# Patient Record
Sex: Female | Born: 1975 | Hispanic: No | Smoking: Never smoker
Health system: Southern US, Community
[De-identification: ages and names within clinical notes are randomized; demographics above are authoritative.]

## PROBLEM LIST (undated history)

## (undated) HISTORY — PX: EYE SURGERY: SHX253

---

## 1998-05-19 ENCOUNTER — Other Ambulatory Visit: Admission: RE | Admit: 1998-05-19 | Discharge: 1998-05-19 | Payer: Self-pay | Admitting: Obstetrics and Gynecology

## 1999-08-15 ENCOUNTER — Other Ambulatory Visit: Admission: RE | Admit: 1999-08-15 | Discharge: 1999-08-15 | Payer: Self-pay | Admitting: Obstetrics and Gynecology

## 2000-01-31 ENCOUNTER — Other Ambulatory Visit: Admission: RE | Admit: 2000-01-31 | Discharge: 2000-01-31 | Payer: Self-pay | Admitting: Obstetrics and Gynecology

## 2000-08-15 ENCOUNTER — Other Ambulatory Visit: Admission: RE | Admit: 2000-08-15 | Discharge: 2000-08-15 | Payer: Self-pay | Admitting: Obstetrics and Gynecology

## 2001-02-18 ENCOUNTER — Encounter: Payer: Self-pay | Admitting: Emergency Medicine

## 2001-02-18 ENCOUNTER — Emergency Department (HOSPITAL_COMMUNITY): Admission: EM | Admit: 2001-02-18 | Discharge: 2001-02-18 | Payer: Self-pay | Admitting: Emergency Medicine

## 2001-09-10 ENCOUNTER — Other Ambulatory Visit: Admission: RE | Admit: 2001-09-10 | Discharge: 2001-09-10 | Payer: Self-pay | Admitting: Obstetrics and Gynecology

## 2002-04-01 ENCOUNTER — Encounter: Payer: Self-pay | Admitting: Obstetrics and Gynecology

## 2002-04-01 ENCOUNTER — Encounter: Admission: RE | Admit: 2002-04-01 | Discharge: 2002-04-01 | Payer: Self-pay | Admitting: Obstetrics and Gynecology

## 2002-04-28 ENCOUNTER — Ambulatory Visit (HOSPITAL_COMMUNITY): Admission: RE | Admit: 2002-04-28 | Discharge: 2002-04-28 | Payer: Self-pay | Admitting: Obstetrics and Gynecology

## 2002-08-01 ENCOUNTER — Inpatient Hospital Stay (HOSPITAL_COMMUNITY): Admission: AD | Admit: 2002-08-01 | Discharge: 2002-08-06 | Payer: Self-pay | Admitting: Obstetrics and Gynecology

## 2002-08-13 ENCOUNTER — Observation Stay (HOSPITAL_COMMUNITY): Admission: AD | Admit: 2002-08-13 | Discharge: 2002-08-14 | Payer: Self-pay | Admitting: Obstetrics and Gynecology

## 2002-09-29 ENCOUNTER — Other Ambulatory Visit: Admission: RE | Admit: 2002-09-29 | Discharge: 2002-09-29 | Payer: Self-pay | Admitting: Obstetrics and Gynecology

## 2003-11-08 ENCOUNTER — Other Ambulatory Visit: Admission: RE | Admit: 2003-11-08 | Discharge: 2003-11-08 | Payer: Self-pay | Admitting: Obstetrics and Gynecology

## 2004-02-13 ENCOUNTER — Encounter: Admission: RE | Admit: 2004-02-13 | Discharge: 2004-02-13 | Payer: Self-pay | Admitting: Gastroenterology

## 2004-05-21 ENCOUNTER — Ambulatory Visit (HOSPITAL_COMMUNITY): Admission: RE | Admit: 2004-05-21 | Discharge: 2004-05-21 | Payer: Self-pay | Admitting: Gastroenterology

## 2004-11-08 ENCOUNTER — Inpatient Hospital Stay (HOSPITAL_COMMUNITY): Admission: AD | Admit: 2004-11-08 | Discharge: 2004-11-08 | Payer: Self-pay | Admitting: Obstetrics and Gynecology

## 2004-12-18 ENCOUNTER — Other Ambulatory Visit: Admission: RE | Admit: 2004-12-18 | Discharge: 2004-12-18 | Payer: Self-pay | Admitting: Obstetrics and Gynecology

## 2005-02-06 ENCOUNTER — Ambulatory Visit (HOSPITAL_COMMUNITY): Admission: RE | Admit: 2005-02-06 | Discharge: 2005-02-06 | Payer: Self-pay | Admitting: Obstetrics and Gynecology

## 2005-06-17 ENCOUNTER — Ambulatory Visit (HOSPITAL_COMMUNITY): Admission: RE | Admit: 2005-06-17 | Discharge: 2005-06-17 | Payer: Self-pay | Admitting: Obstetrics and Gynecology

## 2005-08-28 ENCOUNTER — Inpatient Hospital Stay (HOSPITAL_COMMUNITY): Admission: AD | Admit: 2005-08-28 | Discharge: 2005-08-31 | Payer: Self-pay | Admitting: Obstetrics and Gynecology

## 2010-04-22 ENCOUNTER — Ambulatory Visit: Payer: Self-pay | Admitting: Family Medicine

## 2010-11-12 ENCOUNTER — Ambulatory Visit: Payer: Self-pay | Admitting: Internal Medicine

## 2012-09-14 ENCOUNTER — Other Ambulatory Visit: Payer: Self-pay | Admitting: Obstetrics and Gynecology

## 2012-09-14 DIAGNOSIS — N6002 Solitary cyst of left breast: Secondary | ICD-10-CM

## 2012-09-25 ENCOUNTER — Ambulatory Visit
Admission: RE | Admit: 2012-09-25 | Discharge: 2012-09-25 | Disposition: A | Discharge status: Home / Self Care | Payer: BC Managed Care – PPO | Source: Ambulatory Visit | Attending: Obstetrics and Gynecology | Admitting: Obstetrics and Gynecology

## 2012-09-25 DIAGNOSIS — N6002 Solitary cyst of left breast: Secondary | ICD-10-CM

## 2014-03-29 DIAGNOSIS — I493 Ventricular premature depolarization: Secondary | ICD-10-CM | POA: Insufficient documentation

## 2014-05-27 ENCOUNTER — Other Ambulatory Visit: Payer: Self-pay

## 2014-05-27 DIAGNOSIS — Z1231 Encounter for screening mammogram for malignant neoplasm of breast: Secondary | ICD-10-CM

## 2014-06-06 ENCOUNTER — Ambulatory Visit: Payer: Self-pay

## 2014-06-20 ENCOUNTER — Ambulatory Visit
Admission: RE | Admit: 2014-06-20 | Discharge: 2014-06-20 | Disposition: A | Discharge status: Home / Self Care | Payer: Self-pay | Source: Ambulatory Visit

## 2014-06-20 DIAGNOSIS — Z1231 Encounter for screening mammogram for malignant neoplasm of breast: Secondary | ICD-10-CM

## 2015-11-10 ENCOUNTER — Other Ambulatory Visit: Payer: Self-pay | Admitting: Obstetrics and Gynecology

## 2015-11-10 DIAGNOSIS — R928 Other abnormal and inconclusive findings on diagnostic imaging of breast: Secondary | ICD-10-CM

## 2015-11-27 ENCOUNTER — Ambulatory Visit
Admission: RE | Admit: 2015-11-27 | Discharge: 2015-11-27 | Disposition: A | Discharge status: Home / Self Care | Payer: BLUE CROSS/BLUE SHIELD | Source: Ambulatory Visit | Attending: Obstetrics and Gynecology | Admitting: Obstetrics and Gynecology

## 2015-11-27 DIAGNOSIS — R928 Other abnormal and inconclusive findings on diagnostic imaging of breast: Secondary | ICD-10-CM

## 2017-04-01 ENCOUNTER — Other Ambulatory Visit: Payer: Self-pay

## 2017-04-01 ENCOUNTER — Ambulatory Visit
Admission: EM | Admit: 2017-04-01 | Discharge: 2017-04-01 | Disposition: A | Discharge status: Home / Self Care | Payer: BLUE CROSS/BLUE SHIELD | Attending: Family Medicine | Admitting: Family Medicine

## 2017-04-01 DIAGNOSIS — J029 Acute pharyngitis, unspecified: Secondary | ICD-10-CM

## 2017-04-01 DIAGNOSIS — J069 Acute upper respiratory infection, unspecified: Secondary | ICD-10-CM

## 2017-04-01 LAB — RAPID STREP SCREEN (MED CTR MEBANE ONLY): STREPTOCOCCUS, GROUP A SCREEN (DIRECT): NEGATIVE

## 2017-04-01 MED ORDER — LIDOCAINE VISCOUS 2 % MT SOLN: 15.0000 mL | Freq: Three times a day (TID) | OROMUCOSAL | 0 refills | Status: DC | PRN

## 2017-04-01 NOTE — ED Provider Notes (Signed)
[facility] ____________________________________________  Time seen: Approximately 8:57 AM  I have reviewed the triage vital signs and the nursing notes.   HISTORY  Chief Complaint Sore Throat   HPI Michelle Peck is a 41 y.o. female presenting for evaluation of sore throat that is been present for the last 2-3 days.  Patient reports some accompanying nasal congestion and sneezing.  Reports her son also with similar complaints.  Denies other known sick contacts.  Denies accompanying fevers.  States sore throat is mostly sore with swallowing, and states that sore throat is more sore on the left side going towards her left ear.  Denies ear pain.  Reports continues to eat and drink well.  States did take some over-the-counter Tylenol yesterday.  No other over-the-counter medications taken for the same complaint.  Denies chest pain, shortness of breath, abdominal pain, dysuria,  or rash. Denies recent sickness. Denies recent antibiotic use.   Patient's last menstrual period was 03/18/2017.  Denies pregnancy Dian Queen, MD: PCP   History reviewed. No pertinent past medical history.  There are no active problems to display for this patient.   Past Surgical History:  Procedure Laterality Date  . CESAREAN SECTION     x 2     No current facility-administered medications for this encounter.   Current Outpatient Medications:  .  norethindrone-ethinyl estradiol (JUNEL FE,GILDESS FE,LOESTRIN FE) 1-20 MG-MCG tablet, Take 1 tablet daily by mouth., Disp: , Rfl:  .  lidocaine (XYLOCAINE) 2 % solution, Use as directed 15 mLs every 8 (eight) hours as needed in the mouth or throat (sore throat. gargle and spit as needed for sore throat.)., Disp: 100 mL, Rfl: 0  Allergies Iohexol  History reviewed. No pertinent family history.  Social History Social History   Tobacco Use  . Smoking status: Never Smoker  . Smokeless tobacco: Never Used  Substance Use Topics  . Alcohol  use: Yes    Comment: socially  . Drug use: No    Review of Systems Constitutional: No fever/chills ENT: positive sore throat. Cardiovascular: Denies chest pain. Respiratory: Denies shortness of breath. Gastrointestinal: No abdominal pain.   Musculoskeletal: Negative for back pain. Skin: Negative for rash. ____________________________________________   PHYSICAL EXAM:  VITAL SIGNS: ED Triage Vitals  Enc Vitals Group     BP 04/01/17 0844 122/75     Pulse Rate 04/01/17 0844 66     Resp 04/01/17 0844 17     Temp 04/01/17 0844 98.1 F (36.7 C)     Temp Source 04/01/17 0844 Oral     SpO2 04/01/17 0844 100 %     Weight 04/01/17 0842 155 lb (70.3 kg)     Height 04/01/17 0842 5' 8"  (1.727 m)     Head Circumference --      Peak Flow --      Pain Score 04/01/17 0842 5     Pain Loc --      Pain Edu? --      Excl. in [place]? --     Constitutional: Alert and oriented. Well appearing and in no acute distress. Eyes: Conjunctivae are normal.  Head: Atraumatic. No sinus tenderness to palpation. No swelling. No erythema.  Ears: no erythema, normal TMs bilaterally.   Nose:Mild nasal congestion   Mouth/Throat: Mucous membranes are moist. Mild pharyngeal erythema. No tonsillar swelling or exudate.  Neck: No stridor.  No cervical spine tenderness to palpation. Hematological/Lymphatic/Immunilogical: No cervical lymphadenopathy. Cardiovascular: Normal rate, regular rhythm. Grossly normal heart sounds.  Good peripheral circulation. Respiratory: Normal respiratory effort.  No retractions. No wheezes, rales or rhonchi. Good air movement.  Gastrointestinal: Soft and nontender. Musculoskeletal: Ambulatory with steady gait.  Neurologic:  Normal speech and language. No gait instability. Skin:  Skin appears warm, dry and intact. No rash noted. Psychiatric: Mood and affect are normal. Speech and behavior are normal.  ___________________________________________   LABS (all labs ordered are listed,  but only abnormal results are displayed)  Labs Reviewed  RAPID STREP SCREEN (NOT AT [facility])  CULTURE, GROUP A STREP [facility])    PROCEDURES Procedures   INITIAL IMPRESSION / ASSESSMENT AND PLAN / ED COURSE  Pertinent labs & imaging results that were available during my care of the patient were reviewed by me and considered in my medical decision making (see chart for details).  Well-appearing patient.  No acute distress.  Suspect viral pharyngitis and viral upper respiratory infection. Quick strep negative, will culture.  Encourage rest, fluids, supportive care, over-the-counter antihistamine, and prn lidocaine gargles.Discussed indication, risks and benefits of medications with patient.  Discussed follow up with Primary care physician this week as needed. Discussed follow up and return parameters including no resolution or any worsening concerns. Patient verbalized understanding and agreed to plan.   ____________________________________________   FINAL CLINICAL IMPRESSION(S) / ED DIAGNOSES  Final diagnoses:  Pharyngitis, unspecified etiology  Upper respiratory tract infection, unspecified type     ED Discharge Orders        Ordered    lidocaine (XYLOCAINE) 2 % solution  Every 8 hours PRN     04/01/17 0902       Note: This dictation was prepared with Dragon dictation along with smaller phrase technology. Any transcriptional errors that result from this process are unintentional.         Marylene Land, NP 04/01/17 [phone]

## 2017-04-01 NOTE — ED Triage Notes (Signed)
Patient complains of sore throat that seems to be worse on the left side. Patient states that she feels like the pain radiates to her left ear. Patient reports that symptoms started on Saturday.

## 2017-04-01 NOTE — Discharge Instructions (Signed)
Take medication as prescribed. Rest. Drink plenty of fluids.  ° °Follow up with your primary care physician this week as needed. Return to Urgent care for new or worsening concerns.  ° °

## 2017-04-04 LAB — CULTURE, GROUP A STREP (THRC)

## 2018-01-11 ENCOUNTER — Ambulatory Visit
Admission: EM | Admit: 2018-01-11 | Discharge: 2018-01-11 | Disposition: A | Discharge status: Home / Self Care | Payer: BLUE CROSS/BLUE SHIELD | Attending: Family Medicine | Admitting: Family Medicine

## 2018-01-11 DIAGNOSIS — R05 Cough: Secondary | ICD-10-CM

## 2018-01-11 DIAGNOSIS — J069 Acute upper respiratory infection, unspecified: Secondary | ICD-10-CM | POA: Diagnosis not present

## 2018-01-11 DIAGNOSIS — B9789 Other viral agents as the cause of diseases classified elsewhere: Secondary | ICD-10-CM | POA: Diagnosis not present

## 2018-01-11 LAB — MONONUCLEOSIS SCREEN: Mono Screen: NEGATIVE

## 2018-01-11 NOTE — ED Triage Notes (Signed)
Pt was seen on Wednesday at a urgent care, strep negative but they gave her amoxicillin and she is still having sore throat, feeling bad and headache. Taking the antibiotic along with ibuprofen and tylenol without relief.

## 2018-01-11 NOTE — ED Provider Notes (Signed)
[facility]    CSN: [id number] Arrival date & time: 01/11/18  0102     History   Chief Complaint Chief Complaint  Patient presents with  . Sore Throat    HPI Michelle Peck is a 42 y.o. female.   The history is provided by the patient.  Sore Throat   URI  Presenting symptoms: congestion, cough, rhinorrhea and sore throat   Severity:  Moderate Onset quality:  Sudden Duration:  5 days Timing:  Constant Progression:  Unchanged Chronicity:  New Relieved by:  Nothing Ineffective treatments:  Prescription medications Associated symptoms: myalgias   Associated symptoms: no sinus pain and no wheezing   Risk factors: sick contacts   Risk factors: not elderly, no chronic cardiac disease, no chronic kidney disease, no chronic respiratory disease, no diabetes mellitus, no immunosuppression, no recent illness and no recent travel     History reviewed. No pertinent past medical history.  Patient Active Problem List   Diagnosis Date Noted  . 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 CESAREAN SECTION     x 2    OB History   None      Home Medications    Prior to Admission medications   Medication Sig Start Date End Date Taking? Authorizing Provider  amoxicillin (AMOXIL) 400 MG/5ML suspension  01/08/18  Yes [provider]  citalopram (CELEXA) 20 MG tablet Take 20 mg by mouth daily. 11/17/17  Yes [provider]  norethindrone-ethinyl estradiol (JUNEL FE,GILDESS FE,LOESTRIN FE) 1-20 MG-MCG tablet Take 1 tablet daily by mouth.   Yes [provider]  lidocaine (XYLOCAINE) 2 % solution Use as directed 15 mLs every 8 (eight) hours as needed in the mouth or throat (sore throat. gargle and spit as needed for sore throat.). 04/01/17   Marylene Land, NP    Family History No family history on file.  Social History Social History   Tobacco Use  . Smoking status: Never Smoker  . Smokeless  tobacco: Never Used  Substance Use Topics  . Alcohol use: Yes    Comment: socially  . Drug use: No     Allergies   Iohexol   Review of Systems Review of Systems  HENT: Positive for congestion, rhinorrhea and sore throat. Negative for sinus pain.   Respiratory: Positive for cough. Negative for wheezing.   Musculoskeletal: Positive for myalgias.     Physical Exam Triage Vital Signs ED Triage Vitals  Enc Vitals Group     BP 01/11/18 0952 125/83     Pulse Rate 01/11/18 0952 83     Resp 01/11/18 0952 18     Temp 01/11/18 0952 98.1 F (36.7 C)     Temp Source 01/11/18 0952 Oral     SpO2 01/11/18 0952 99 %     Weight 01/11/18 0956 160 lb (72.6 kg)     Height --      Head Circumference --      Peak Flow --      Pain Score 01/11/18 0956 5     Pain Loc --      Pain Edu? --      Excl. in [place]? --    No data found.  Updated Vital Signs BP 125/83 (BP Location: Left Arm)   Pulse 83   Temp 98.1 F (36.7 C) (Oral)   Resp 18   Wt 72.6 kg   LMP 12/21/2017 (Exact Date)   SpO2 99%  BMI 24.33 kg/m   Visual Acuity Right Eye Distance:   Left Eye Distance:   Bilateral Distance:    Right Eye Near:   Left Eye Near:    Bilateral Near:     Physical Exam  Constitutional: She appears well-developed and well-nourished. No distress.  HENT:  Head: Normocephalic and atraumatic.  Right Ear: Tympanic membrane, external ear and ear canal normal.  Left Ear: Tympanic membrane, external ear and ear canal normal.  Nose: Mucosal edema and rhinorrhea present. No nose lacerations, sinus tenderness, nasal deformity, septal deviation or nasal septal hematoma. No epistaxis.  No foreign bodies. Right sinus exhibits no maxillary sinus tenderness and no frontal sinus tenderness. Left sinus exhibits no maxillary sinus tenderness and no frontal sinus tenderness.  Mouth/Throat: Uvula is midline and mucous membranes are normal. Posterior oropharyngeal erythema present. No oropharyngeal exudate,  posterior oropharyngeal edema or tonsillar abscesses. No tonsillar exudate.  Eyes: Pupils are equal, round, and reactive to light. Conjunctivae and EOM are normal. Right eye exhibits no discharge. Left eye exhibits no discharge. No scleral icterus.  Neck: Normal range of motion. Neck supple. No thyromegaly present.  Cardiovascular: Normal rate, regular rhythm and normal heart sounds.  Pulmonary/Chest: Effort normal and breath sounds normal. No respiratory distress. She has no wheezes. She has no rales.  Lymphadenopathy:    She has no cervical adenopathy.  Skin: She is not diaphoretic.  Nursing note and vitals reviewed.    UC Treatments / Results  Labs (all labs ordered are listed, but only abnormal results are displayed) Labs Reviewed  MONONUCLEOSIS SCREEN    EKG None  Radiology No results found.  Procedures Procedures (including critical care time)  Medications Ordered in UC Medications - No data to display  Initial Impression / Assessment and Plan / UC Course  I have reviewed the triage vital signs and the nursing notes.  Pertinent labs & imaging results that were available during my care of the patient were reviewed by me and considered in my medical decision making (see chart for details).      Final Clinical Impressions(s) / UC Diagnoses   Final diagnoses:  Viral URI with cough     Discharge Instructions     Rest, fluids, tylenol/advil    ED Prescriptions    None     1. Labs results and diagnosis reviewed with patient 2. Recommend supportive treatment as above 3. Follow-up prn if symptoms worsen or don't improve   Controlled Substance Prescriptions [place] Controlled Substance Registry consulted? Not Applicable   Norval Gable, MD 01/11/18 1430

## 2018-01-11 NOTE — Discharge Instructions (Signed)
Rest, fluids, tylenol/advil

## 2019-01-30 ENCOUNTER — Other Ambulatory Visit: Payer: Self-pay

## 2019-01-30 ENCOUNTER — Encounter: Payer: Self-pay | Admitting: Gynecology

## 2019-01-30 ENCOUNTER — Ambulatory Visit
Admission: EM | Admit: 2019-01-30 | Discharge: 2019-01-30 | Disposition: A | Discharge status: Home / Self Care | Payer: BC Managed Care – PPO | Attending: Internal Medicine | Admitting: Internal Medicine

## 2019-01-30 DIAGNOSIS — X500XXA Overexertion from strenuous movement or load, initial encounter: Secondary | ICD-10-CM | POA: Diagnosis not present

## 2019-01-30 DIAGNOSIS — S39012A Strain of muscle, fascia and tendon of lower back, initial encounter: Secondary | ICD-10-CM | POA: Diagnosis not present

## 2019-01-30 MED ORDER — KETOROLAC TROMETHAMINE 60 MG/2ML IM SOLN
60.0000 mg | Freq: Once | INTRAMUSCULAR | Status: AC
  Administered 2019-01-30: 13:00:00 60 mg via INTRAMUSCULAR

## 2019-01-30 MED ORDER — CYCLOBENZAPRINE HCL 10 MG PO TABS: ORAL_TABLET | ORAL | 0 refills | Status: DC

## 2019-01-30 NOTE — ED Provider Notes (Signed)
[facility]    CSN: [id number] Arrival date & time: 01/30/19  1155      History   Chief Complaint Chief Complaint  Patient presents with  . Appointment  . Back Pain    HPI Michelle Peck is a 43 y.o. female. who presents with low back pain which developed this am after trying to pick up laundry from the floor. She could not raise up due to the pain. She took one dose of Aleeve at 11 am and is not helping. Denies pain radiating to her legs or paresthesia of lower legs. Denies past hx of having back issues or pain like this.     History reviewed. No pertinent past medical history.  Patient Active Problem List   Diagnosis Date Noted  . 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 CESAREAN SECTION     x 2    OB History   No obstetric history on file.      Home Medications    Prior to Admission medications   Medication Sig Start Date End Date Taking? Authorizing Provider  citalopram (CELEXA) 20 MG tablet Take 20 mg by mouth daily. 11/17/17  Yes [provider]  norethindrone-ethinyl estradiol (JUNEL FE,GILDESS FE,LOESTRIN FE) 1-20 MG-MCG tablet Take 1 tablet daily by mouth.   Yes [provider]  amoxicillin (AMOXIL) 400 MG/5ML suspension  01/08/18   [provider]  lidocaine (XYLOCAINE) 2 % solution Use as directed 15 mLs every 8 (eight) hours as needed in the mouth or throat (sore throat. gargle and spit as needed for sore throat.). 04/01/17   Marylene Land, NP    Family History History reviewed. No pertinent family history.  Social History Social History   Tobacco Use  . Smoking status: Never Smoker  . Smokeless tobacco: Never Used  Substance Use Topics  . Alcohol use: Yes    Comment: socially  . Drug use: No     Allergies   Iohexol   Review of Systems Review of Systems  Gastrointestinal: Negative for abdominal pain.       Denies bowel incontinence  Genitourinary:  Negative for difficulty urinating.  Musculoskeletal: Positive for back pain.  Skin: Negative for rash and wound.  Neurological: Negative for weakness and numbness.   Physical Exam Triage Vital Signs ED Triage Vitals  Enc Vitals Group     BP 01/30/19 1210 114/77     Pulse Rate 01/30/19 1210 80     Resp 01/30/19 1210 16     Temp 01/30/19 1210 98.1 F (36.7 C)     Temp Source 01/30/19 1210 Oral     SpO2 01/30/19 1210 99 %     Weight 01/30/19 1209 160 lb (72.6 kg)     Height 01/30/19 1209 5' 8"  (1.727 m)     Head Circumference --      Peak Flow --      Pain Score 01/30/19 1209 7     Pain Loc --      Pain Edu? --      Excl. in [place]? --    No data found.  Updated Vital Signs BP 114/77 (BP Location: Left Arm)   Pulse 80   Temp 98.1 F (36.7 C) (Oral)   Resp 16   Ht 5' 8"  (1.727 m)   Wt 160 lb (72.6 kg)   LMP 01/06/2019   SpO2 99%   BMI 24.33 kg/m   Visual Acuity Right Eye  Distance:   Left Eye Distance:   Bilateral Distance:    Right Eye Near:   Left Eye Near:    Bilateral Near:     Physical Exam Vitals signs and nursing note reviewed.  Constitutional:      General: She is in acute distress.     Comments: In pain when she tries to straighten her back fully. Stands leaning a little forward  HENT:     Right Ear: External ear normal.     Left Ear: External ear normal.     Nose: Nose normal.  Eyes:     General: No scleral icterus.    Conjunctiva/sclera: Conjunctivae normal.  Pulmonary:     Effort: Pulmonary effort is normal.  Abdominal:     General: Abdomen is flat. Bowel sounds are normal. There is no distension.     Palpations: Abdomen is soft. There is no mass.     Tenderness: There is no abdominal tenderness. There is no guarding or rebound.     Hernia: No hernia is present.  Musculoskeletal: Normal range of motion.     Comments: BACK- R lower sacral region next to SI is tense and very tender. No vertebral tenderness of the L/S region. I could not fully do  my exam initially due to her pain, so 20 minutes afte giving her Toradol 60 mg IM which helped a little, she was able to straighten her back a little better, but still not fully. Anterior flexion was normal, unable to flex back. R lateral flexion and L thorax rotation provoked the pain on her lower back bilaterally. R lower back pain provoked when examining L hip,to flex it and try to externally flex it caused too much pain. Checking her R hip did not provoke as much pain. Has neg SLR.  She also could not lift her thighs off the table to check her strength since it provoked her pain.   Skin:    General: Skin is warm.     Findings: No rash.  Neurological:     Mental Status: She is alert and oriented to person, place, and time.     Motor: No weakness.     Gait: Gait normal.     Deep Tendon Reflexes: Reflexes normal.  Psychiatric:        Mood and Affect: Mood normal.        Behavior: Behavior normal.        Thought Content: Thought content normal.        Judgment: Judgment normal.    UC Treatments / Results  Labs (all labs ordered are listed, but only abnormal results are displayed) Labs Reviewed - No data to display  EKG   Radiology No results found.  Procedures Procedures (including critical care time)  Medications Ordered in UC Medications - No data to display  Initial Impression / Assessment and Plan / UC Course  I have reviewed the triage vital signs and the nursing notes. She was given Toradol 60 mg IM here and ice pack to apply to her back while the Toradol worked. I sent her home to continue the ice, and placed her on Flexeril 1/2 - 1 tid x 7 days prn. May continue the [facility] as she has been as well. See instructions Caudal equina symptoms reviewed and if they occur needs to go to ER.   Final Clinical Impressions(s) / UC Diagnoses   Final diagnoses:  None   Discharge Instructions   None    ED Prescriptions  None     PDMP not reviewed this encounter.    Shelby Mattocks, PA-C 01/30/19 1404

## 2019-01-30 NOTE — ED Triage Notes (Signed)
Patient with back pain. Per patient bend over to pick laundry up and was unable to get back up x this morning.

## 2019-01-30 NOTE — Discharge Instructions (Addendum)
Continue icing your back 15-20 min every 2 hours as able. You may take Aleeve dose 7 pm, then continue twice a day for 7 days to help pain and inflammation Take the muscle relaxer 1/2 to 1 up to three times a day for muscle spasm After 48 hours you may alternate with heat.  Follow up with your family Dr next week.

## 2019-02-26 ENCOUNTER — Ambulatory Visit
Admission: EM | Admit: 2019-02-26 | Discharge: 2019-02-26 | Disposition: A | Discharge status: Home / Self Care | Payer: BC Managed Care – PPO | Attending: Family Medicine | Admitting: Family Medicine

## 2019-02-26 ENCOUNTER — Encounter: Payer: Self-pay | Admitting: Emergency Medicine

## 2019-02-26 ENCOUNTER — Other Ambulatory Visit: Payer: Self-pay

## 2019-02-26 ENCOUNTER — Ambulatory Visit (INDEPENDENT_AMBULATORY_CARE_PROVIDER_SITE_OTHER): Payer: BC Managed Care – PPO

## 2019-02-26 DIAGNOSIS — S6000XA Contusion of unspecified finger without damage to nail, initial encounter: Secondary | ICD-10-CM

## 2019-02-26 DIAGNOSIS — Y9368 Activity, volleyball (beach) (court): Secondary | ICD-10-CM | POA: Diagnosis not present

## 2019-02-26 DIAGNOSIS — M79644 Pain in right finger(s): Secondary | ICD-10-CM

## 2019-02-26 NOTE — Discharge Instructions (Signed)
Rest, ice, over the counter tylenol/motrin as needed

## 2019-02-26 NOTE — ED Triage Notes (Signed)
Patient states that she was playing volleyball with her kids yesterday and when she went up to block the ball she injured her right 4th finger.  Patient c/o swelling and pain in her right 4th finger.

## 2019-02-28 NOTE — ED Provider Notes (Signed)
[facility]    CSN: [id number] Arrival date & time: 02/26/19  0855      History   Chief Complaint Chief Complaint  Patient presents with  . Finger Injury    APPT right 4th finger    HPI Shalea DANELIA SNODGRASS is a 43 y.o. female.   43 yo female with a c/o right 4th finger pain after injuring it yesterday playing volleyball. States the ball hit/jammed her finger.      History reviewed. No pertinent past medical history.  Patient Active Problem List   Diagnosis Date Noted  . 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 CESAREAN SECTION     x 2    OB History   No obstetric history on file.      Home Medications    Prior to Admission medications   Medication Sig Start Date End Date Taking? Authorizing Provider  citalopram (CELEXA) 20 MG tablet Take 20 mg by mouth daily. 11/17/17  Yes [provider]  norethindrone-ethinyl estradiol (JUNEL FE,GILDESS FE,LOESTRIN FE) 1-20 MG-MCG tablet Take 1 tablet daily by mouth.   Yes [provider]  cyclobenzaprine (FLEXERIL) 10 MG tablet 1/2 to 1 tid prn muscle spasms 01/30/19   Rodriguez-Southworth, Sunday Spillers, PA-C    Family History Family History  Problem Relation Age of Onset  . Healthy Mother   . Healthy Father     Social History Social History   Tobacco Use  . Smoking status: Never Smoker  . Smokeless tobacco: Never Used  Substance Use Topics  . Alcohol use: Yes    Comment: socially  . Drug use: No     Allergies   Iohexol   Review of Systems Review of Systems   Physical Exam Triage Vital Signs ED Triage Vitals  Enc Vitals Group     BP 02/26/19 0907 115/83     Pulse Rate 02/26/19 0907 76     Resp 02/26/19 0907 14     Temp 02/26/19 0907 98.1 F (36.7 C)     Temp Source 02/26/19 0907 Oral     SpO2 02/26/19 0907 98 %     Weight 02/26/19 0904 160 lb (72.6 kg)     Height 02/26/19 0904 5' 8"  (1.727 m)     Head Circumference --      Peak  Flow --      Pain Score 02/26/19 0904 6     Pain Loc --      Pain Edu? --      Excl. in [place]? --    No data found.  Updated Vital Signs BP 115/83 (BP Location: Left Arm)   Pulse 76   Temp 98.1 F (36.7 C) (Oral)   Resp 14   Ht 5' 8"  (1.727 m)   Wt 72.6 kg   LMP 02/05/2019 (Approximate)   SpO2 98%   BMI 24.33 kg/m   Visual Acuity Right Eye Distance:   Left Eye Distance:   Bilateral Distance:    Right Eye Near:   Left Eye Near:    Bilateral Near:     Physical Exam Vitals signs and nursing note reviewed.  Constitutional:      General: She is not in acute distress.    Appearance: She is not toxic-appearing or diaphoretic.  Musculoskeletal:     Right hand: She exhibits bony tenderness (over the ring finger). She exhibits normal range of motion, normal two-point discrimination, normal capillary refill, no deformity, no laceration  and no swelling. Normal sensation noted. Normal strength noted.     Comments: Hand neurovascularly intact  Neurological:     Mental Status: She is alert.      UC Treatments / Results  Labs (all labs ordered are listed, but only abnormal results are displayed) Labs Reviewed - No data to display  EKG   Radiology No results found.  Procedures Procedures (including critical care time)  Medications Ordered in UC Medications - No data to display  Initial Impression / Assessment and Plan / UC Course  I have reviewed the triage vital signs and the nursing notes.  Pertinent labs & imaging results that were available during my care of the patient were reviewed by me and considered in my medical decision making (see chart for details).      Final Clinical Impressions(s) / UC Diagnoses   Final diagnoses:  Contusion of finger of right hand, unspecified finger, initial encounter     Discharge Instructions     Rest, ice, over the counter tylenol/motrin as needed    ED Prescriptions    None      1. x-ray results and diagnosis  reviewed with patient 2. Recommend supportive treatment as above  3. Follow-up prn if symptoms worsen or don't improve  PDMP not reviewed this encounter.   Norval Gable, MD 02/28/19 1757

## 2020-10-19 IMAGING — CR DG FINGER RING 2+V*R*
3 series · 3 of 3 positions shown · non-contrast
Comparison: No recent.

CLINICAL DATA: Injury with swelling and pain.

EXAM:
RIGHT RING FINGER 2+V

[finger ap]
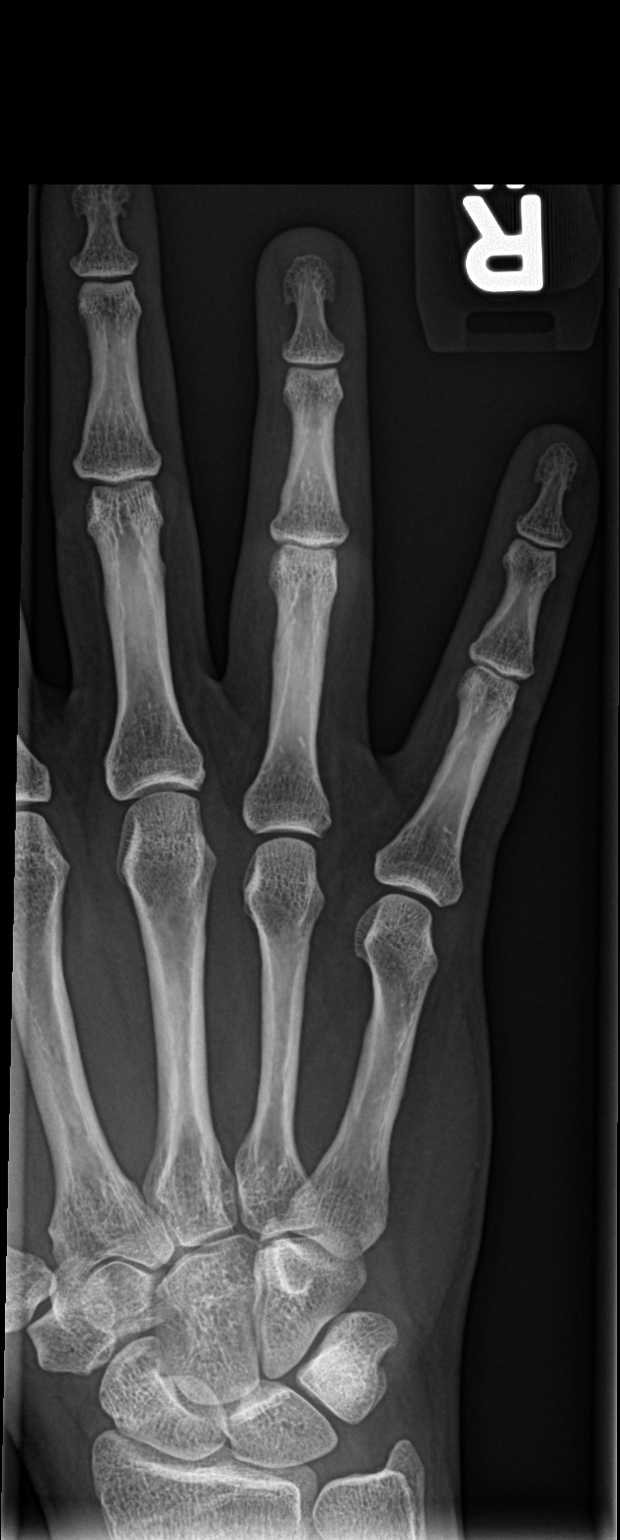

[finger obl]
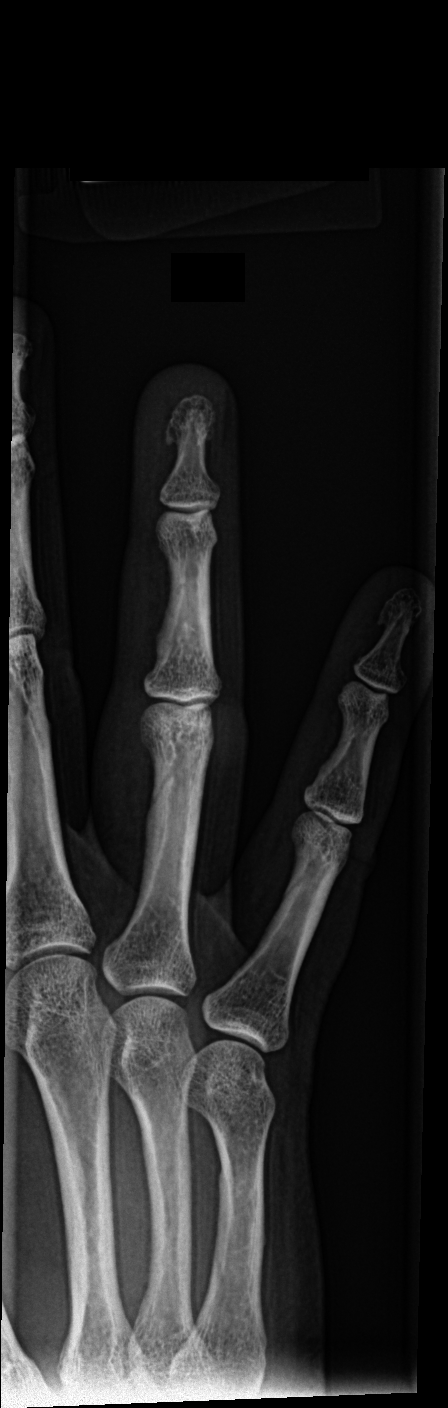

[finger lat]
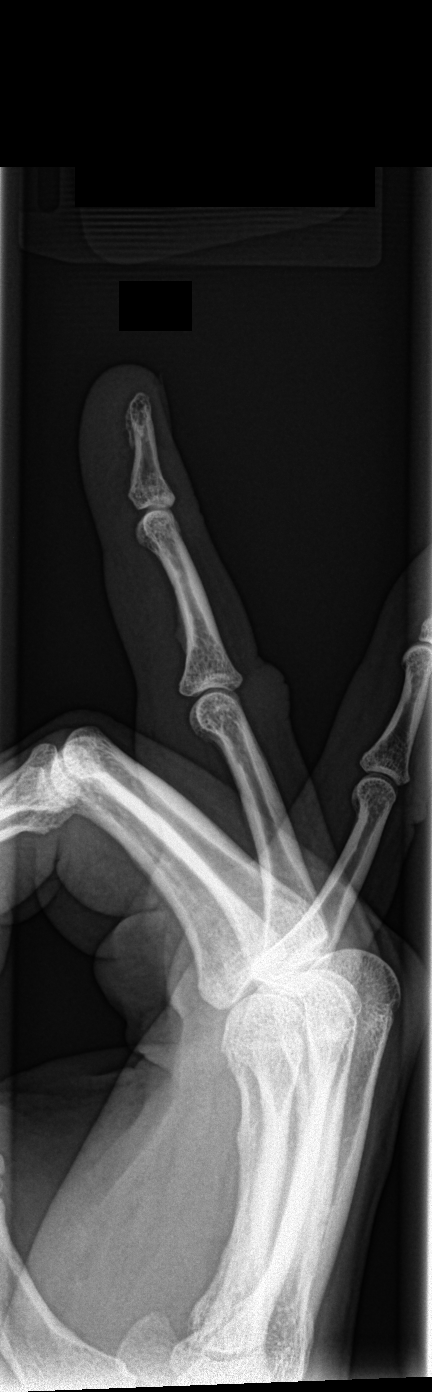

[3 of 3 positions shown; findings below may reference images not displayed]

FINDINGS: No acute soft tissue bony abnormality. No evidence of fracture
dislocation. No radiopaque foreign body.
IMPRESSION: No acute abnormality.

## 2021-09-18 ENCOUNTER — Other Ambulatory Visit: Payer: Self-pay

## 2021-09-18 ENCOUNTER — Emergency Department (HOSPITAL_BASED_OUTPATIENT_CLINIC_OR_DEPARTMENT_OTHER): Payer: BC Managed Care – PPO

## 2021-09-18 ENCOUNTER — Emergency Department (HOSPITAL_COMMUNITY): Payer: BC Managed Care – PPO | Admitting: Certified Registered Nurse Anesthetist

## 2021-09-18 ENCOUNTER — Encounter (HOSPITAL_COMMUNITY)
Admission: EM | Disposition: A | Discharge status: Home / Self Care | Payer: Self-pay | Source: Home / Self Care | Attending: Emergency Medicine

## 2021-09-18 ENCOUNTER — Ambulatory Visit (HOSPITAL_BASED_OUTPATIENT_CLINIC_OR_DEPARTMENT_OTHER)
Admission: EM | Admit: 2021-09-18 | Discharge: 2021-09-18 | Disposition: A | Discharge status: Home / Self Care | Payer: BC Managed Care – PPO | Attending: Emergency Medicine | Admitting: Emergency Medicine

## 2021-09-18 ENCOUNTER — Encounter (HOSPITAL_BASED_OUTPATIENT_CLINIC_OR_DEPARTMENT_OTHER): Payer: Self-pay | Admitting: Emergency Medicine

## 2021-09-18 DIAGNOSIS — K353 Acute appendicitis with localized peritonitis, without perforation or gangrene: Secondary | ICD-10-CM | POA: Diagnosis present

## 2021-09-18 DIAGNOSIS — K3580 Unspecified acute appendicitis: Secondary | ICD-10-CM | POA: Diagnosis not present

## 2021-09-18 HISTORY — PX: LAPAROSCOPIC APPENDECTOMY: SHX408

## 2021-09-18 LAB — CBC
HCT: 36.8 % (ref 36.0–46.0)
Hemoglobin: 12 g/dL (ref 12.0–15.0)
MCH: 31.7 pg (ref 26.0–34.0)
MCHC: 32.6 g/dL (ref 30.0–36.0)
MCV: 97.4 fL (ref 80.0–100.0)
Platelets: 294 10*3/uL (ref 150–400)
RBC: 3.78 MIL/uL — ABNORMAL LOW (ref 3.87–5.11)
RDW: 13.2 % (ref 11.5–15.5)
WBC: 9.3 10*3/uL (ref 4.0–10.5)
nRBC: 0 % (ref 0.0–0.2)

## 2021-09-18 LAB — COMPREHENSIVE METABOLIC PANEL
ALT: 13 U/L (ref 0–44)
AST: 14 U/L — ABNORMAL LOW (ref 15–41)
Albumin: 4.3 g/dL (ref 3.5–5.0)
Alkaline Phosphatase: 45 U/L (ref 38–126)
Anion gap: 10 (ref 5–15)
BUN: 7 mg/dL (ref 6–20)
CO2: 24 mmol/L (ref 22–32)
Calcium: 9.2 mg/dL (ref 8.9–10.3)
Chloride: 102 mmol/L (ref 98–111)
Creatinine, Ser: 0.65 mg/dL (ref 0.44–1.00)
GFR, Estimated: >60 mL/min (ref >60)
Glucose, Bld: 94 mg/dL (ref 70–99)
Potassium: 3.7 mmol/L (ref 3.5–5.1)
Sodium: 136 mmol/L (ref 135–145)
Total Bilirubin: 1.3 mg/dL — ABNORMAL HIGH (ref 0.3–1.2)
Total Protein: 6.9 g/dL (ref 6.5–8.1)

## 2021-09-18 LAB — URINALYSIS, ROUTINE W REFLEX MICROSCOPIC
Bilirubin Urine: NEGATIVE
Glucose, UA: NEGATIVE mg/dL
Hgb urine dipstick: NEGATIVE
Ketones, ur: NEGATIVE mg/dL
Leukocytes,Ua: NEGATIVE
Nitrite: NEGATIVE
Protein, ur: NEGATIVE mg/dL
Specific Gravity, Urine: <1.005 — ABNORMAL LOW (ref 1.005–1.030)
pH: 6.5 (ref 5.0–8.0)

## 2021-09-18 LAB — LIPASE, BLOOD: Lipase: 19 U/L (ref 11–51)

## 2021-09-18 LAB — HCG, SERUM, QUALITATIVE: Preg, Serum: NEGATIVE

## 2021-09-18 SURGERY — APPENDECTOMY, LAPAROSCOPIC
Anesthesia: General

## 2021-09-18 MED ORDER — OXYCODONE HCL 5 MG PO TABS: 5.0000 mg | ORAL_TABLET | Freq: Once | ORAL | Status: DC | PRN

## 2021-09-18 MED ORDER — MIDAZOLAM HCL 2 MG/2ML IJ SOLN
INTRAMUSCULAR | Status: AC
  Filled 2021-09-18: qty 2

## 2021-09-18 MED ORDER — LACTATED RINGERS IV SOLN: INTRAVENOUS | Status: DC

## 2021-09-18 MED ORDER — CELECOXIB 200 MG PO CAPS
ORAL_CAPSULE | ORAL | Status: AC
  Administered 2021-09-18: 200 mg via ORAL
  Filled 2021-09-18: qty 1

## 2021-09-18 MED ORDER — MIDAZOLAM HCL 2 MG/2ML IJ SOLN
INTRAMUSCULAR | Status: DC | PRN
  Administered 2021-09-18: 2 mg via INTRAVENOUS

## 2021-09-18 MED ORDER — CHLORHEXIDINE GLUCONATE CLOTH 2 % EX PADS: 6.0000 | MEDICATED_PAD | Freq: Once | CUTANEOUS | Status: DC

## 2021-09-18 MED ORDER — FENTANYL CITRATE (PF) 250 MCG/5ML IJ SOLN
INTRAMUSCULAR | Status: AC
  Filled 2021-09-18: qty 5

## 2021-09-18 MED ORDER — ARTIFICIAL TEARS OPHTHALMIC OINT
TOPICAL_OINTMENT | OPHTHALMIC | Status: AC
  Filled 2021-09-18: qty 3.5

## 2021-09-18 MED ORDER — 0.9 % SODIUM CHLORIDE (POUR BTL) OPTIME
TOPICAL | Status: DC | PRN
  Administered 2021-09-18: 1000 mL

## 2021-09-18 MED ORDER — OXYCODONE HCL 5 MG PO TABS: 5.0000 mg | ORAL_TABLET | Freq: Four times a day (QID) | ORAL | 0 refills | Status: DC | PRN

## 2021-09-18 MED ORDER — ACETAMINOPHEN 500 MG PO TABS
ORAL_TABLET | ORAL | Status: AC
  Administered 2021-09-18: 1000 mg via ORAL
  Filled 2021-09-18: qty 2

## 2021-09-18 MED ORDER — LIDOCAINE 2% (20 MG/ML) 5 ML SYRINGE
INTRAMUSCULAR | Status: AC
  Filled 2021-09-18: qty 5

## 2021-09-18 MED ORDER — ONDANSETRON HCL 4 MG/2ML IJ SOLN: 4.0000 mg | Freq: Once | INTRAMUSCULAR | Status: DC | PRN

## 2021-09-18 MED ORDER — CHLORHEXIDINE GLUCONATE 0.12 % MT SOLN
15.0000 mL | OROMUCOSAL | Status: AC
  Filled 2021-09-18: qty 15

## 2021-09-18 MED ORDER — LIDOCAINE 2% (20 MG/ML) 5 ML SYRINGE
INTRAMUSCULAR | Status: DC | PRN
  Administered 2021-09-18: 60 mg via INTRAVENOUS

## 2021-09-18 MED ORDER — CHLORHEXIDINE GLUCONATE 0.12 % MT SOLN
OROMUCOSAL | Status: AC
  Administered 2021-09-18: 15 mL via OROMUCOSAL
  Filled 2021-09-18: qty 15

## 2021-09-18 MED ORDER — MORPHINE SULFATE (PF) 2 MG/ML IV SOLN: 1.0000 mg | INTRAVENOUS | Status: DC | PRN

## 2021-09-18 MED ORDER — BUPIVACAINE-EPINEPHRINE (PF) 0.25% -1:200000 IJ SOLN
INTRAMUSCULAR | Status: AC
  Filled 2021-09-18: qty 30

## 2021-09-18 MED ORDER — PROPOFOL 10 MG/ML IV BOLUS
INTRAVENOUS | Status: DC | PRN
  Administered 2021-09-18: 200 mg via INTRAVENOUS

## 2021-09-18 MED ORDER — METRONIDAZOLE 500 MG/100ML IV SOLN
500.0000 mg | Freq: Once | INTRAVENOUS | Status: AC
  Administered 2021-09-18: 500 mg via INTRAVENOUS
  Filled 2021-09-18: qty 100

## 2021-09-18 MED ORDER — BUPIVACAINE-EPINEPHRINE 0.5% -1:200000 IJ SOLN
INTRAMUSCULAR | Status: AC
  Filled 2021-09-18: qty 1

## 2021-09-18 MED ORDER — FENTANYL CITRATE (PF) 100 MCG/2ML IJ SOLN
25.0000 ug | INTRAMUSCULAR | Status: DC | PRN
  Administered 2021-09-18: 50 ug via INTRAVENOUS

## 2021-09-18 MED ORDER — SODIUM CHLORIDE 0.9 % IR SOLN
Status: DC | PRN
  Administered 2021-09-18: 1000 mL

## 2021-09-18 MED ORDER — KETOROLAC TROMETHAMINE 30 MG/ML IJ SOLN
INTRAMUSCULAR | Status: DC | PRN
  Administered 2021-09-18: 30 mg via INTRAVENOUS

## 2021-09-18 MED ORDER — ROCURONIUM BROMIDE 10 MG/ML (PF) SYRINGE
PREFILLED_SYRINGE | INTRAVENOUS | Status: DC | PRN
  Administered 2021-09-18: 60 mg via INTRAVENOUS

## 2021-09-18 MED ORDER — SUGAMMADEX SODIUM 200 MG/2ML IV SOLN
INTRAVENOUS | Status: DC | PRN
  Administered 2021-09-18: 150 mg via INTRAVENOUS

## 2021-09-18 MED ORDER — ONDANSETRON HCL 4 MG/2ML IJ SOLN
4.0000 mg | INTRAMUSCULAR | Status: DC | PRN
Start: 2021-09-18 — End: 2021-09-18
  Administered 2021-09-18: 4 mg via INTRAVENOUS

## 2021-09-18 MED ORDER — ONDANSETRON HCL 4 MG/2ML IJ SOLN
INTRAMUSCULAR | Status: AC
  Filled 2021-09-18: qty 2

## 2021-09-18 MED ORDER — CELECOXIB 200 MG PO CAPS: 200.0000 mg | ORAL_CAPSULE | Freq: Once | ORAL | Status: AC

## 2021-09-18 MED ORDER — KETOROLAC TROMETHAMINE 30 MG/ML IJ SOLN
INTRAMUSCULAR | Status: AC
  Filled 2021-09-18: qty 1

## 2021-09-18 MED ORDER — FENTANYL CITRATE (PF) 100 MCG/2ML IJ SOLN
INTRAMUSCULAR | Status: AC
  Filled 2021-09-18: qty 2

## 2021-09-18 MED ORDER — OXYCODONE HCL 5 MG/5ML PO SOLN: 5.0000 mg | Freq: Once | ORAL | Status: DC | PRN

## 2021-09-18 MED ORDER — ACETAMINOPHEN 500 MG PO TABS: 1000.0000 mg | ORAL_TABLET | Freq: Once | ORAL | Status: AC

## 2021-09-18 MED ORDER — SODIUM CHLORIDE 0.9 % IV SOLN
2.0000 g | Freq: Once | INTRAVENOUS | Status: AC
  Administered 2021-09-18: 2 g via INTRAVENOUS
  Filled 2021-09-18: qty 20

## 2021-09-18 MED ORDER — DEXAMETHASONE SODIUM PHOSPHATE 10 MG/ML IJ SOLN
INTRAMUSCULAR | Status: DC | PRN
  Administered 2021-09-18: 10 mg via INTRAVENOUS

## 2021-09-18 MED ORDER — BUPIVACAINE-EPINEPHRINE (PF) 0.25% -1:200000 IJ SOLN
INTRAMUSCULAR | Status: DC | PRN
  Administered 2021-09-18: 7 mL

## 2021-09-18 MED ORDER — FENTANYL CITRATE (PF) 250 MCG/5ML IJ SOLN
INTRAMUSCULAR | Status: DC | PRN
  Administered 2021-09-18: 50 ug via INTRAVENOUS
  Administered 2021-09-18: 100 ug via INTRAVENOUS
  Administered 2021-09-18 (×2): 50 ug via INTRAVENOUS

## 2021-09-18 MED ORDER — IBUPROFEN 800 MG PO TABS: 800.0000 mg | ORAL_TABLET | Freq: Three times a day (TID) | ORAL | 0 refills | Status: DC | PRN

## 2021-09-18 MED ORDER — DEXAMETHASONE SODIUM PHOSPHATE 10 MG/ML IJ SOLN
INTRAMUSCULAR | Status: AC
  Filled 2021-09-18: qty 1

## 2021-09-18 SURGICAL SUPPLY — 41 items
ADH SKN CLS APL DERMABOND .7 (GAUZE/BANDAGES/DRESSINGS) ×1
APL PRP STRL LF DISP 70% ISPRP (MISCELLANEOUS) ×1
BAG COUNTER SPONGE SURGICOUNT (BAG) ×2 IMPLANT
BAG SPEC RTRVL 10 TROC 200 (ENDOMECHANICALS) ×1
BAG SPNG CNTER NS LX DISP (BAG) ×1
CANISTER SUCT 3000ML PPV (MISCELLANEOUS) ×2 IMPLANT
CHLORAPREP W/TINT 26 (MISCELLANEOUS) ×2 IMPLANT
COVER SURGICAL LIGHT HANDLE (MISCELLANEOUS) ×2 IMPLANT
CUTTER FLEX LINEAR 45M (STAPLE) ×2 IMPLANT
DERMABOND ADVANCED (GAUZE/BANDAGES/DRESSINGS) ×1
DERMABOND ADVANCED .7 DNX12 (GAUZE/BANDAGES/DRESSINGS) ×1 IMPLANT
ELECT REM PT RETURN 9FT ADLT (ELECTROSURGICAL) ×2
ELECTRODE REM PT RTRN 9FT ADLT (ELECTROSURGICAL) ×1 IMPLANT
GLOVE BIO SURGEON STRL SZ8 (GLOVE) ×2 IMPLANT
GLOVE BIOGEL PI IND STRL 8 (GLOVE) ×1 IMPLANT
GLOVE BIOGEL PI INDICATOR 8 (GLOVE) ×1
GOWN STRL REUS W/ TWL LRG LVL3 (GOWN DISPOSABLE) ×2 IMPLANT
GOWN STRL REUS W/ TWL XL LVL3 (GOWN DISPOSABLE) ×1 IMPLANT
GOWN STRL REUS W/TWL LRG LVL3 (GOWN DISPOSABLE) ×4
GOWN STRL REUS W/TWL XL LVL3 (GOWN DISPOSABLE) ×2
KIT BASIN OR (CUSTOM PROCEDURE TRAY) ×2 IMPLANT
KIT TURNOVER KIT B (KITS) ×2 IMPLANT
NS IRRIG 1000ML POUR BTL (IV SOLUTION) ×2 IMPLANT
PAD ARMBOARD 7.5X6 YLW CONV (MISCELLANEOUS) ×4 IMPLANT
POUCH RETRIEVAL ECOSAC 10 (ENDOMECHANICALS) ×1 IMPLANT
POUCH RETRIEVAL ECOSAC 10MM (ENDOMECHANICALS) ×2
RELOAD STAPLE 45 3.5 BLU ETS (ENDOMECHANICALS) ×1 IMPLANT
RELOAD STAPLE TA45 3.5 REG BLU (ENDOMECHANICALS) ×2 IMPLANT
SCISSORS LAP 5X35 DISP (ENDOMECHANICALS) ×2 IMPLANT
SET IRRIG TUBING LAPAROSCOPIC (IRRIGATION / IRRIGATOR) ×2 IMPLANT
SET TUBE SMOKE EVAC HIGH FLOW (TUBING) ×2 IMPLANT
SHEARS HARMONIC ACE PLUS 36CM (ENDOMECHANICALS) ×2 IMPLANT
SPECIMEN JAR SMALL (MISCELLANEOUS) ×2 IMPLANT
SUT MNCRL AB 4-0 PS2 18 (SUTURE) ×1 IMPLANT
TOWEL GREEN STERILE (TOWEL DISPOSABLE) ×2 IMPLANT
TOWEL GREEN STERILE FF (TOWEL DISPOSABLE) ×2 IMPLANT
TRAY LAPAROSCOPIC MC (CUSTOM PROCEDURE TRAY) ×2 IMPLANT
TROCAR XCEL BLADELESS 5X75MML (TROCAR) ×4 IMPLANT
TROCAR XCEL BLUNT TIP 100MML (ENDOMECHANICALS) ×2 IMPLANT
WARMER LAPAROSCOPE (MISCELLANEOUS) ×2 IMPLANT
WATER STERILE IRR 1000ML POUR (IV SOLUTION) ×2 IMPLANT

## 2021-09-18 NOTE — Anesthesia Procedure Notes (Signed)
Procedure Name: Intubation ?Date/Time: 09/18/2021 3:51 PM ?Performed by: Carolan Clines, CRNA ?Pre-anesthesia Checklist: Patient identified, Emergency Drugs available, Suction available and Patient being monitored ?Patient Re-evaluated:Patient Re-evaluated prior to induction ?Oxygen Delivery Method: Circle System Utilized ?Preoxygenation: Pre-oxygenation with 100% oxygen ?Induction Type: IV induction ?Ventilation: Mask ventilation without difficulty ?Laryngoscope Size: Mac and 3 ?Grade View: Grade II ?Tube type: Oral ?Tube size: 7.0 mm ?Number of attempts: 1 ?Airway Equipment and Method: Stylet ?Placement Confirmation: ETT inserted through vocal cords under direct vision, positive ETCO2 and breath sounds checked- equal and bilateral ?Secured at: 22 cm ?Tube secured with: Tape ?Dental Injury: Teeth and Oropharynx as per pre-operative assessment  ? ? ? ? ?

## 2021-09-18 NOTE — Discharge Instructions (Signed)
[facility] ? ?Please arrive at least 30 min before your appointment to complete your check in paperwork.  If you are unable to arrive 30 min prior to your appointment time we may have to cancel or reschedule you. ?LAPAROSCOPIC SURGERY: POST OP INSTRUCTIONS ?Always review your discharge instruction sheet given to you by the facility where your surgery was performed. ?IF YOU HAVE DISABILITY OR FAMILY LEAVE FORMS, YOU MUST BRING THEM TO THE OFFICE FOR PROCESSING.   ?DO NOT GIVE THEM TO YOUR DOCTOR. ? ?PAIN CONTROL ? ?First take acetaminophen (Tylenol) AND/or ibuprofen (Advil) to control your pain after surgery.  Follow directions on package.  Taking acetaminophen (Tylenol) and/or ibuprofen (Advil) regularly after surgery will help to control your pain and lower the amount of prescription pain medication you may need.  You should not take more than 4,000 mg (4 grams) of acetaminophen (Tylenol) in 24 hours.  You should not take ibuprofen (Advil), aleve, motrin, naprosyn or other NSAIDS if you have a history of stomach ulcers or chronic kidney disease.  ?A prescription for pain medication may be given to you upon discharge.  Take your pain medication as prescribed, if you still have uncontrolled pain after taking acetaminophen (Tylenol) or ibuprofen (Advil). ?Use ice packs to help control pain. ?If you need a refill on your pain medication, please contact your pharmacy.  They will contact our office to request authorization. Prescriptions will not be filled after 5pm or on week-ends. ? ?HOME MEDICATIONS ?Take your usually prescribed medications unless otherwise directed. ? ?DIET ?You should follow a light diet the first few days after arrival home.  Be sure to include lots of fluids daily. Avoid fatty, fried foods.  ? ?CONSTIPATION ?It is common to experience some constipation after surgery and if you are taking pain medication.  Increasing fluid intake and taking a stool softener (such as Colace)  will usually help or prevent this problem from occurring.  A mild laxative (Milk of Magnesia or Miralax) should be taken according to package instructions if there are no bowel movements after 48 hours. ? ?WOUND/INCISION CARE ?Most patients will experience some swelling and bruising in the area of the incisions.  Ice packs will help.  Swelling and bruising can take several days to resolve.  ?Unless discharge instructions indicate otherwise, follow guidelines below  ?STERI-STRIPS - you may remove your outer bandages 48 hours after surgery, and you may shower at that time.  You have steri-strips (small skin tapes) in place directly over the incision.  These strips should be left on the skin for 7-10 days.   ?DERMABOND/SKIN GLUE - you may shower in 24 hours.  The glue will flake off over the next 2-3 weeks. ?Any sutures or staples will be removed at the office during your follow-up visit. ? ?ACTIVITIES ?You may resume regular (light) daily activities beginning the next day--such as daily self-care, walking, climbing stairs--gradually increasing activities as tolerated.  You may have sexual intercourse when it is comfortable.  Refrain from any heavy lifting or straining until approved by your doctor. ?You may drive when you are no longer taking prescription pain medication, you can comfortably wear a seatbelt, and you can safely maneuver your car and apply brakes. ? ?FOLLOW-UP ?You should see your doctor in the office for a follow-up appointment approximately 2-3 weeks after your surgery.  You should have been given your post-op/follow-up appointment when your surgery was scheduled.  If you did not receive a post-op/follow-up appointment, make sure  that you call for this appointment within a day or two after you arrive home to insure a convenient appointment time. ? ? ?WHEN TO CALL YOUR DOCTOR: ?Fever over 101.0 ?Inability to urinate ?Continued bleeding from incision. ?Increased pain, redness, or drainage from the  incision. ?Increasing abdominal pain ? ?The clinic staff is available to answer your questions during regular business hours.  Please don?t hesitate to call and ask to speak to one of the nurses for clinical concerns.  If you have a medical emergency, go to the nearest emergency room or call 911.  A surgeon from [facility] is always on call at the hospital. ?[address] [place], [place], [place]  [address] ? [address], [place], [place]   [address] ?[phone] ? [phone] ? FAX [phone] ? ? ? ? ?Managing Your Pain After Surgery Without Opioids ? ? ? ?Thank you for participating in our program to help patients manage their pain after surgery without opioids. This is part of our effort to provide you with the best care possible, without exposing you or your family to the risk that opioids pose. ? ?What pain can I expect after surgery? ?You can expect to have some pain after surgery. This is normal. The pain is typically worse the day after surgery, and quickly begins to get better. ?Many studies have found that many patients are able to manage their pain after surgery with Over-the-Counter (OTC) medications such as Tylenol and Motrin. If you have a condition that does not allow you to take Tylenol or Motrin, notify your surgical team. ? ?How will I manage my pain? ?The best strategy for controlling your pain after surgery is around the clock pain control with Tylenol (acetaminophen) and Motrin (ibuprofen or Advil). Alternating these medications with each other allows you to maximize your pain control. In addition to Tylenol and Motrin, you can use heating pads or ice packs on your incisions to help reduce your pain. ? ?How will I alternate your regular strength over-the-counter pain medication? ?You will take a dose of pain medication every three hours. ?Start by taking 650 mg of Tylenol (2 pills of 325 mg) ?3 hours later take 600 mg of Motrin (3 pills of 200 mg) ?3 hours after  taking the Motrin take 650 mg of Tylenol ?3 hours after that take 600 mg of Motrin. ? ? ?- 1 - ? ?See example - if your first dose of Tylenol is at 12:00 PM ? ? ?12:00 PM Tylenol 650 mg (2 pills of 325 mg)  ?3:00 PM Motrin 600 mg (3 pills of 200 mg)  ?6:00 PM Tylenol 650 mg (2 pills of 325 mg)  ?9:00 PM Motrin 600 mg (3 pills of 200 mg)  ?Continue alternating every 3 hours  ? ?We recommend that you follow this schedule around-the-clock for at least 3 days after surgery, or until you feel that it is no longer needed. Use the table on the last page of this handout to keep track of the medications you are taking. ?Important: ?Do not take more than 3073m of Tylenol or 32039mof Motrin in a 24-hour period. ?Do not take ibuprofen/Motrin if you have a history of bleeding stomach ulcers, severe kidney disease, &/or actively taking a blood thinner ? ?What if I still have pain? ?If you have pain that is not controlled with the over-the-counter pain medications (Tylenol and Motrin or Advil) you might have what we call ?breakthrough? pain. You will receive a prescription  for a small amount of an opioid pain medication such as Oxycodone, Tramadol, or Tylenol with Codeine. Use these opioid pills in the first 24 hours after surgery if you have breakthrough pain. Do not take more than 1 pill every 4-6 hours. ? ?If you still have uncontrolled pain after using all opioid pills, don't hesitate to call our staff using the number provided. We will help make sure you are managing your pain in the best way possible, and if necessary, we can provide a prescription for additional pain medication. ? ? ?Day 1   ? ?Time  ?Name of Medication Number of pills taken  ?Amount of Acetaminophen  ?Pain Level  ? ?Comments  ?AM PM       ?AM PM       ?AM PM       ?AM PM       ?AM PM       ?AM PM       ?AM PM       ?AM PM       ?Total Daily amount of Acetaminophen ?Do not take more than  3,000 mg per day    ? ? ?Day 2   ? ?Time  ?Name of Medication  Number of pills ?taken  ?Amount of Acetaminophen  ?Pain Level  ? ?Comments  ?AM PM       ?AM PM       ?AM PM       ?AM PM       ?AM PM       ?AM PM       ?AM PM       ?AM PM       ?Total Daily amount of Acetaminop

## 2021-09-18 NOTE — ED Notes (Signed)
Care Handoff/Report given to [facility]. All Questions Answered. ?

## 2021-09-18 NOTE — Transfer of Care (Signed)
Immediate Anesthesia Transfer of Care Note ? ?Patient: Michelle Peck ? ?Procedure(s) Performed: APPENDECTOMY LAPAROSCOPIC ? ?Patient Location: PACU ? ?Anesthesia Type:General ? ?Level of Consciousness: awake, alert  and oriented ? ?Airway & Oxygen Therapy: Patient Spontanous Breathing ? ?Post-op Assessment: Report given to RN ? ?Post vital signs: Reviewed and stable ? ?Last Vitals:  ?Vitals Value Taken Time  ?BP 122/77   ?Temp    ?Pulse 88   ?Resp    ?SpO2 96   ? ? ?Last Pain:  ?Vitals:  ? 09/18/21 1517  ?TempSrc: Axillary  ?PainSc: 4   ?   ? ?Patients Stated Pain Goal: 2 (09/18/21 1517) ? ?Complications: No notable events documented. ?

## 2021-09-18 NOTE — ED Triage Notes (Signed)
Pt arrives to ED with c/o abdominal pain. This started last night and is located in the RLQ.  ?

## 2021-09-18 NOTE — Anesthesia Preprocedure Evaluation (Addendum)
Anesthesia Evaluation  ?Patient identified by MRN, date of birth, ID band ?Patient awake ? ? ? ?Reviewed: ?Allergy & Precautions, NPO status , Patient's Chart, lab work & pertinent test results ? ?History of Anesthesia Complications ?Negative for: history of anesthetic complications ? ?Airway ?Mallampati: II ? ?TM Distance: >3 FB ?Neck ROM: Full ? ? ? Dental ? ?(+) Dental Advisory Given, Teeth Intact ?  ?Pulmonary ?neg pulmonary ROS,  ?  ?Pulmonary exam normal ? ? ? ? ? ? ? Cardiovascular ?negative cardio ROS ?Normal cardiovascular exam ? ? ?  ?Neuro/Psych ?negative neurological ROS ? negative psych ROS  ? GI/Hepatic ?Neg liver ROS,  ?Appendicitis ? ?  ?Endo/Other  ?negative endocrine ROS ? Renal/GU ?negative Renal ROS  ? ?  ?Musculoskeletal ?negative musculoskeletal ROS ?(+)  ? Abdominal ?  ?Peds ? Hematology ?negative hematology ROS ?(+)   ?Anesthesia Other Findings ? ? Reproductive/Obstetrics ? ?  ? ? ? ? ? ? ? ? ? ? ? ? ? ?  ?  ? ? ? ? ? ? ? ?Anesthesia Physical ?Anesthesia Plan ? ?ASA: 1 and emergent ? ?Anesthesia Plan: General  ? ?Post-op Pain Management: Celebrex PO (pre-op)* and Tylenol PO (pre-op)*  ? ?Induction: Intravenous and Rapid sequence ? ?PONV Risk Score and Plan: 3 and Treatment may vary due to age or medical condition, Ondansetron, Dexamethasone and Midazolam ? ?Airway Management Planned: Oral ETT ? ?Additional Equipment: None ? ?Intra-op Plan:  ? ?Post-operative Plan: Extubation in OR ? ?Informed Consent: I have reviewed the patients History and Physical, chart, labs and discussed the procedure including the risks, benefits and alternatives for the proposed anesthesia with the patient or authorized representative who has indicated his/her understanding and acceptance.  ? ? ? ?Dental advisory given ? ?Plan Discussed with: CRNA and Anesthesiologist ? ?Anesthesia Plan Comments:   ? ? ? ? ? ?Anesthesia Quick Evaluation ? ?

## 2021-09-18 NOTE — Op Note (Signed)
Appendectomy, Laparoscopic, Procedure Note ? ?Indications: The patient presented with a history of right-sided abdominal pain. A CT scan  revealed findings consistent with acute appendicitis.Medical and surgical options discussed for treatment.The procedure has been discussed with the patient.  Alternative therapies have been discussed with the patient.  Operative risks include bleeding,  Infection,  Organ injury,  abscess, bowel injury,  Nerve injury,  Blood vessel injury,  DVT,  Pulmonary embolism,  Death,  And possible reoperation.  Medical management risks include worsening of present situation.  The success of the procedure is 50 -90 % at treating patients symptoms.  The patient understands and agrees to proceed.  ? ?Pre-operative Diagnosis: Acute appendicitis without mention of peritonitis ? ?Post-operative Diagnosis: Same ? ?Surgeon: Turner Daniels  MD  ? ?Assistants: OR staff  ? ?Anesthesia: General endotracheal anesthesia and Local anesthesia 0.25.% bupivacaine, with epinephrine ? ?ASA Class: 2 ? ?Procedure Details  ?The patient was seen again in the Holding Room. The risks, benefits, complications, treatment options, and expected outcomes were discussed with the patient and/or family. The possibilities of reaction to medication, pulmonary aspiration, perforation of viscus, bleeding, recurrent infection, finding a normal appendix, the need for additional procedures, failure to diagnose a condition, and creating a complication requiring transfusion or operation were discussed. There was concurrence with the proposed plan and informed consent was obtained. The site of surgery was properly noted/marked. The patient was taken to Operating Room, identified as Michelle Peck and the procedure verified as Appendectomy. A Time Out was held and the above information confirmed. ? ?The patient was placed in the supine position and general anesthesia was induced, along with placement of orogastric tube, Venodyne boots,  and a Foley catheter. The abdomen was prepped and draped in a sterile fashion. A one centimeter infraumbilical incision was made and the peritoneal cavity was accessed using the OPEN  technique. The pneumoperitoneum was then established to steady pressure of 12 mmHg. A 12 mm port was placed through the umbilical incision. Additional 5 mm cannulas then placed in the left lower quadrant of the abdomen  and right upper quadrant under direct vision. A careful evaluation of the entire abdomen was carried out. The patient was placed in Trendelenburg and left lateral decubitus position. The small intestines were retracted in the cephalad and left lateral direction away from the pelvis and right lower quadrant. The patient was found to have an enlarged and inflamed appendix that was extending into the pelvis. There was no evidence of perforation. ? ?The appendix was carefully dissected. A window was made in the mesoappendix at the base of the appendix. A harmonic scalpel was used across the mesoappendix. The appendix was divided at its base using an endo-GIA stapler. Minimal appendiceal stump was left in place. There was no evidence of bleeding, leakage, or complication after division of the appendix. Irrigation was also performed and irrigate suctioned from the abdomen as well. ? ?The umbilical port site was closed using 0 vicryl pursestring sutures fashion at the level of the fascia. The trocar site skin wounds were closed using 4 0 monocryl. ? ?Instrument, sponge, and needle counts were correct at the conclusion of the case.  ? ?Findings: ?The appendix was found to be inflamed. There were not signs of necrosis.  There was not perforation. There was not abscess formation. ? ?Estimated Blood Loss:  Minimal ?        ?Drains: none  ?        ?Total IV Fluids: per  record  ?        ?Specimens: appendix  ?        ?Complications:  None; patient tolerated the procedure well. ?        ?Disposition: PACU - hemodynamically stable. ?         ?Condition: stable ?  ?

## 2021-09-18 NOTE — H&P (Signed)
? ? ? ?Michelle Peck ?16-Dec-1975  ?[id number].   ? ?Chief Complaint/Reason for Consult: acute appendicitis ? ?HPI:  ?This is a 46 yo female with no significant PMH who began having vague abdominal pain last night.  The pain progressed overnight and has now localized to the RLQ.  She denies any nausea, vomiting, chest pain, SOB, dysuria, or any other symptoms.  She has not have a fever.  Due to persistent pain, she presented to [facility] ED for further work up.  She was noted to have a normal WBC, but a CT scan concerning for acute appendicitis.  She has been transferred to [facility] for definitive surgical management. ? ?ROS: ?ROS: Please see HPI, otherwise all other systems have been reviewed and are negative. ? ?Family History  ?Problem Relation Age of Onset  ? Healthy Mother   ? Healthy Father   ? ? ?History reviewed. No pertinent past medical history. ? ?Past Surgical History:  ?Procedure Laterality Date  ? CESAREAN SECTION    ? x 2  ? ? ?Social History:  reports that she has never smoked. She has never used smokeless tobacco. She reports current alcohol use. She reports that she does not use drugs. ? ?Allergies:  ?Allergies  ?Allergen Reactions  ? Iohexol   ?   Desc: pt broke out with 2 facial hives, one on her back and one on her left shoulder. ?  ? Red Dye   ? ? ?(Not in a hospital admission) ? ? ? ?Physical Exam: ?Blood pressure 127/81, pulse 82, temperature 98.7 ?F (37.1 ?C), temperature source Temporal, resp. rate (!) 22, height 5' 8"  (1.727 m), weight 72.6 kg, SpO2 100 %. ?General: pleasant, WD, WN white female who is laying in bed in NAD ?HEENT: head is normocephalic, atraumatic.  Sclera are noninjected.  PERRL.  Ears and nose without any masses or lesions.  Mouth is pink and moist ?Heart: regular, rate, and rhythm.  Normal s1,s2. No obvious murmurs, gallops, or rubs noted.  Palpable radial and pedal pulses bilaterally ?Lungs: CTAB, no wheezes, rhonchi, or rales noted.  Respiratory effort nonlabored ?Abd: soft, tender in  RLQ at McBurney's point.  No evidence of peritonitis. ND, +BS, no masses, hernias, or organomegaly ?MS: all 4 extremities are symmetrical with no cyanosis, clubbing, or edema. ?Skin: warm and dry with no masses, lesions, or rashes ?Neuro: Cranial nerves 2-12 grossly intact, sensation is normal throughout ?Psych: A&Ox3 with an appropriate affect. ? ? ?Results for orders placed or performed during the hospital encounter of 09/18/21 (from the past 48 hour(s))  ?Lipase, blood     Status: None  ? Collection Time: 09/18/21 10:45 AM  ?Result Value Ref Range  ? Lipase 19 11 - 51 U/L  ?  Comment: Performed at [facility], [address] [place], [place] [address]  ?Comprehensive metabolic panel     Status: Abnormal  ? Collection Time: 09/18/21 10:45 AM  ?Result Value Ref Range  ? Sodium 136 135 - 145 mmol/L  ? Potassium 3.7 3.5 - 5.1 mmol/L  ? Chloride 102 98 - 111 mmol/L  ? CO2 24 22 - 32 mmol/L  ? Glucose, Bld 94 70 - 99 mg/dL  ?  Comment: Glucose reference range applies only to samples taken after fasting for at least 8 hours.  ? BUN 7 6 - 20 mg/dL  ? Creatinine, Ser 0.65 0.44 - 1.00 mg/dL  ? Calcium 9.2 8.9 - 10.3 mg/dL  ? Total Protein 6.9 6.5 - 8.1 g/dL  ?  Albumin 4.3 3.5 - 5.0 g/dL  ? AST 14 (L) 15 - 41 U/L  ? ALT 13 0 - 44 U/L  ? Alkaline Phosphatase 45 38 - 126 U/L  ? Total Bilirubin 1.3 (H) 0.3 - 1.2 mg/dL  ? GFR, Estimated >60 >60 mL/min  ?  Comment: (NOTE) ?Calculated using the CKD-EPI Creatinine Equation (2021) ?  ? Anion gap 10 5 - 15  ?  Comment: Performed at [facility], [address] [place], [place] [address]  ?CBC     Status: Abnormal  ? Collection Time: 09/18/21 10:45 AM  ?Result Value Ref Range  ? WBC 9.3 4.0 - 10.5 K/uL  ? RBC 3.78 (L) 3.87 - 5.11 MIL/uL  ? Hemoglobin 12.0 12.0 - 15.0 g/dL  ? HCT 36.8 36.0 - 46.0 %  ? MCV 97.4 80.0 - 100.0 fL  ? MCH 31.7 26.0 - 34.0 pg  ? MCHC 32.6 30.0 - 36.0 g/dL  ? RDW 13.2 11.5 - 15.5 %  ? Platelets 294 150 - 400 K/uL  ?  nRBC 0.0 0.0 - 0.2 %  ?  Comment: Performed at [facility], [address], [place], [place] [address]  ?hCG, serum, qualitative     Status: None  ? Collection Time: 09/18/21 10:45 AM  ?Result Value Ref Range  ? Preg, Serum NEGATIVE NEGATIVE  ?  Comment:        ?THE SENSITIVITY OF THIS ?METHODOLOGY IS >10 mIU/mL. ?Performed at [facility], [address], [place], [place] [address] ?  ?Urinalysis, Routine w reflex microscopic     Status: Abnormal  ? Collection Time: 09/18/21 12:00 PM  ?Result Value Ref Range  ? Color, Urine COLORLESS (A) YELLOW  ? APPearance CLEAR CLEAR  ? Specific Gravity, Urine <1.005 (L) 1.005 - 1.030  ? pH 6.5 5.0 - 8.0  ? Glucose, UA NEGATIVE NEGATIVE mg/dL  ? Hgb urine dipstick NEGATIVE NEGATIVE  ? Bilirubin Urine NEGATIVE NEGATIVE  ? Ketones, ur NEGATIVE NEGATIVE mg/dL  ? Protein, ur NEGATIVE NEGATIVE mg/dL  ? Nitrite NEGATIVE NEGATIVE  ? Leukocytes,Ua NEGATIVE NEGATIVE  ?  Comment: Performed at [facility], [address], [place], [place] [address]  ? ?CT ABDOMEN PELVIS WO CONTRAST ? ?Result Date: 09/18/2021 ?CLINICAL DATA:  Right lower quadrant pain. EXAM: CT ABDOMEN AND PELVIS WITHOUT CONTRAST TECHNIQUE: Multidetector CT imaging of the abdomen and pelvis was performed following the standard protocol without IV contrast. RADIATION DOSE REDUCTION: This exam was performed according to the departmental dose-optimization program which includes automated exposure control, adjustment of the mA and/or kV according to patient size and/or use of iterative reconstruction technique. COMPARISON:  02/13/2004 FINDINGS: Lower chest: No acute abnormality. Hepatobiliary: No focal liver abnormality is seen. No gallstones, gallbladder wall thickening, or biliary dilatation. Pancreas: Unremarkable. No pancreatic ductal dilatation or surrounding inflammatory changes. Spleen: Normal in size without focal abnormality. Adrenals/Urinary Tract: Adrenal  glands are unremarkable. Kidneys are normal, without renal calculi, focal lesion, or hydronephrosis. Bladder is unremarkable. Stomach/Bowel: Stomach is within normal limits. No evidence of bowel wall thickening, distention, or inflammatory changes. Dilated appendix measuring 11 mm in diameter with periappendiceal inflammatory changes. No focal fluid collection to suggest an abscess. Vascular/Lymphatic: No significant vascular findings are present. No enlarged abdominal or pelvic lymph nodes. Reproductive: Uterus and bilateral adnexa are unremarkable. Other: No abdominal wall hernia or abnormality. No abdominopelvic ascites. Musculoskeletal: No acute osseous abnormality. No aggressive osseous lesion. IMPRESSION: 1. Findings consistent with acute appendicitis. No focal fluid  collection to suggest an abscess. Electronically Signed   By: Kathreen Devoid M.D.   On: 09/18/2021 11:46   ? ? ? ?Assessment/Plan ?Acute appendicitis ?The patient has been seen and examined.  Her labs, vitals, and imaging have been personally reviewed.  She appears to have appendicitis on her CT scan and clinically she does as well.  We discussed the treatment plans for appendicitis such as non-operative management vs operative intervention.  Recommendation being for operative management to minimize risk of recurrence or other complications.  I have discussed the procedure and risks of appendectomy. The risks include but are not limited to bleeding, infection, wound problems, anesthesia, injury to intra-abdominal organs, possibility of postoperative ileus. She seems to understand and agrees with the plan.  We also discussed plans for likely discharge home from the PACU pending her case goes well with no reason for admission noted during surgery.  DC instructions discussed with the patient as well as follow up recommendations. ? ?FEN - NPO/IVFs ?VTE - none preop ?ID - Rocephin/Flagyl ?Admit - obs, likely DC home from PACU ? ?I reviewed nursing notes,  ED provider notes, last 24 h vitals and pain scores, last 48 h intake and output, last 24 h labs and trends, and last 24 h imaging results. ? ?Erroll Luna MD  ?[facility] ?09/18/2021, 2

## 2021-09-18 NOTE — ED Notes (Signed)
Report given to [facility] and the Floor RN. ?

## 2021-09-18 NOTE — ED Provider Notes (Signed)
?[facility] ?Provider Note ? ? ?CSN: [id number] ?Arrival date & time: 09/18/21  1022 ? ?  ? ?History ? ?Chief Complaint  ?Patient presents with  ? Abdominal Pain  ? ? ?Michelle Peck is a 46 y.o. female. ? ?Patient with past surgical history of cesarean section, no other abdominal surgeries presents to the emergency department for evaluation of abdominal pain.  Patient states that the pain began last evening as a vague lower abdominal discomfort.  No associated fevers, chest pain, shortness of breath, nausea, vomiting, or diarrhea.  Pain continued overnight and now is more right-sided, right lower.  She wanted to get this checked on due to upcoming work meetings.  No treatments prior to arrival.  Somewhat worse with movement.  No history of ovarian cysts or other pelvic problems. ? ? ?  ? ?Home Medications ?Prior to Admission medications   ?Medication Sig Start Date End Date Taking? Authorizing Provider  ?citalopram (CELEXA) 20 MG tablet Take 20 mg by mouth daily. 11/17/17   [provider]  ?cyclobenzaprine (FLEXERIL) 10 MG tablet 1/2 to 1 tid prn muscle spasms 01/30/19   Rodriguez-Southworth, Sunday Spillers, PA-C  ?norethindrone-ethinyl estradiol (JUNEL FE,GILDESS FE,LOESTRIN FE) 1-20 MG-MCG tablet Take 1 tablet daily by mouth.    [provider]  ?   ? ?Allergies    ?Iohexol and Red dye   ? ?Review of Systems   ?Review of Systems ? ?Physical Exam ?Updated Vital Signs ?BP 132/89 (BP Location: Right Arm)   Pulse 88   Temp 98.7 ?F (37.1 ?C) (Temporal)   Resp 16   Ht 5' 8"  (1.727 m)   Wt 72.6 kg   SpO2 100%   BMI 24.34 kg/m?  ? ?Physical Exam ?Vitals and nursing note reviewed.  ?Constitutional:   ?   General: She is not in acute distress. ?   Appearance: She is well-developed.  ?HENT:  ?   Head: Normocephalic and atraumatic.  ?   Right Ear: External ear normal.  ?   Left Ear: External ear normal.  ?   Nose: Nose normal.  ?Eyes:  ?   Conjunctiva/sclera: Conjunctivae normal.   ?Cardiovascular:  ?   Rate and Rhythm: Normal rate and regular rhythm.  ?   Heart sounds: No murmur heard. ?Pulmonary:  ?   Effort: No respiratory distress.  ?   Breath sounds: No wheezing, rhonchi or rales.  ?Abdominal:  ?   Palpations: Abdomen is soft.  ?   Tenderness: There is abdominal tenderness (Patient winces in pain when I press over the right lateral and right lower abdomen) in the right lower quadrant, periumbilical area and suprapubic area. There is no guarding or rebound. Positive signs include Rovsing's sign. Negative signs include Murphy's sign and McBurney's sign.  ?Musculoskeletal:  ?   Cervical back: Normal range of motion and neck supple.  ?   Right lower leg: No edema.  ?   Left lower leg: No edema.  ?Skin: ?   General: Skin is warm and dry.  ?   Findings: No rash.  ?Neurological:  ?   General: No focal deficit present.  ?   Mental Status: She is alert. Mental status is at baseline.  ?   Motor: No weakness.  ?Psychiatric:     ?   Mood and Affect: Mood normal.  ? ? ?ED Results / Procedures / Treatments   ?Labs ?(all labs ordered are listed, but only abnormal results are displayed) ?Labs Reviewed  ?COMPREHENSIVE METABOLIC  PANEL - Abnormal; Notable for the following components:  ?    Result Value  ? AST 14 (*)   ? Total Bilirubin 1.3 (*)   ? All other components within normal limits  ?CBC - Abnormal; Notable for the following components:  ? RBC 3.78 (*)   ? All other components within normal limits  ?URINALYSIS, ROUTINE W REFLEX MICROSCOPIC - Abnormal; Notable for the following components:  ? Color, Urine COLORLESS (*)   ? Specific Gravity, Urine <1.005 (*)   ? All other components within normal limits  ?LIPASE, BLOOD  ?HCG, SERUM, QUALITATIVE  ? ? ?EKG ?None ? ?Radiology ?CT ABDOMEN PELVIS WO CONTRAST ? ?Result Date: 09/18/2021 ?CLINICAL DATA:  Right lower quadrant pain. EXAM: CT ABDOMEN AND PELVIS WITHOUT CONTRAST TECHNIQUE: Multidetector CT imaging of the abdomen and pelvis was performed following  the standard protocol without IV contrast. RADIATION DOSE REDUCTION: This exam was performed according to the departmental dose-optimization program which includes automated exposure control, adjustment of the mA and/or kV according to patient size and/or use of iterative reconstruction technique. COMPARISON:  02/13/2004 FINDINGS: Lower chest: No acute abnormality. Hepatobiliary: No focal liver abnormality is seen. No gallstones, gallbladder wall thickening, or biliary dilatation. Pancreas: Unremarkable. No pancreatic ductal dilatation or surrounding inflammatory changes. Spleen: Normal in size without focal abnormality. Adrenals/Urinary Tract: Adrenal glands are unremarkable. Kidneys are normal, without renal calculi, focal lesion, or hydronephrosis. Bladder is unremarkable. Stomach/Bowel: Stomach is within normal limits. No evidence of bowel wall thickening, distention, or inflammatory changes. Dilated appendix measuring 11 mm in diameter with periappendiceal inflammatory changes. No focal fluid collection to suggest an abscess. Vascular/Lymphatic: No significant vascular findings are present. No enlarged abdominal or pelvic lymph nodes. Reproductive: Uterus and bilateral adnexa are unremarkable. Other: No abdominal wall hernia or abnormality. No abdominopelvic ascites. Musculoskeletal: No acute osseous abnormality. No aggressive osseous lesion. IMPRESSION: 1. Findings consistent with acute appendicitis. No focal fluid collection to suggest an abscess. Electronically Signed   By: Kathreen Devoid M.D.   On: 09/18/2021 11:46   ? ?Procedures ?Procedures  ? ? ?Medications Ordered in ED ?Medications - No data to display ? ?ED Course/ Medical Decision Making/ A&P ?  ? ?Patient seen and examined. History obtained directly from patient.  ? ?Labs/EKG: Ordered CBC, CMP, lipase, UA, pregnancy. ? ?Imaging: I had a shared decision-making discussion with patient after CBC returned with normal white blood cell count.  Discussed  risks and benefits of CT imaging.  Discussed that we could obtain imaging now to more definitively rule out possibility of appendicitis or other infectious or serious cause of abdominal pain.  Also given option of close monitoring knowing that if she continues to have pain or this worsens over the next 12 to 24 hours, she would need to return for reconsideration of imaging.  She would like to proceed with imaging today.  She does have an IV contrast allergy so study will be ordered without contrast. ? ?Medications/Fluids: None ordered ? ?Most recent vital signs reviewed and are as follows: ?BP 132/89 (BP Location: Right Arm)   Pulse 88   Temp 98.7 ?F (37.1 ?C) (Temporal)   Resp 16   Ht 5' 8"  (1.727 m)   Wt 72.6 kg   SpO2 100%   BMI 24.34 kg/m?  ? ?Initial impression: Right-sided abdominal pain that seems to have become more localized to the right side of the abdomen this morning.  Moderate concern for appendicitis.  Patient will be evaluated for this. ? ?12:09  PM Reassessment performed. Patient appears comfortable.  Declines pain medication. ? ?Labs personally reviewed and interpreted including: CBC with normal white blood cell count, normal hemoglobin; CMP unremarkable; lipase negative; UA without signs of infection; negative pregnancy. ? ?Imaging personally visualized and interpreted including: CT imaging of the abdomen pelvis, agree patient with uncomplicated appendicitis. ? ?Reviewed pertinent lab work and imaging with patient at bedside. Questions answered.  Antibiotics ordered.  No allergies. ? ?Most current vital signs reviewed and are as follows: ?BP 110/75   Pulse 83   Temp 98.7 ?F (37.1 ?C) (Temporal)   Resp 14   Ht 5' 8"  (1.727 m)   Wt 72.6 kg   SpO2 98%   BMI 24.34 kg/m?  ? ?Plan: General surgery consult, transfer. ? ?12:43 PM Reassessment performed. Patient appears comfortable.  Awaiting IV antibiotics. ? ?I spoke with Dr. Brantley Stage of general surgery who accepts patient.  Initially plan was  to transfer to the emergency department, however I spoke with Maximiano Coss with general surgery who asked that we transfer patient directly to short stay for OR.  They will be booking OR time this afternoon.  Patient will remain n

## 2021-09-19 ENCOUNTER — Encounter (HOSPITAL_COMMUNITY): Payer: Self-pay | Admitting: Surgery

## 2021-09-19 NOTE — Anesthesia Postprocedure Evaluation (Signed)
Anesthesia Post Note ? ?Patient: Michelle Peck ? ?Procedure(s) Performed: APPENDECTOMY LAPAROSCOPIC ? ?  ? ?Patient location during evaluation: PACU ?Anesthesia Type: General ?Level of consciousness: awake and alert ?Pain management: pain level controlled ?Vital Signs Assessment: post-procedure vital signs reviewed and stable ?Respiratory status: spontaneous breathing, nonlabored ventilation and respiratory function stable ?Cardiovascular status: stable and blood pressure returned to baseline ?Anesthetic complications: no ? ? ?No notable events documented. ? ?Last Vitals:  ?Vitals:  ? 09/18/21 1755 09/18/21 1810  ?BP: 116/82 112/87  ?Pulse: 88 80  ?Resp: 13 17  ?Temp:  36.7 ?C  ?SpO2: 97% 97%  ?  ?Last Pain:  ?Vitals:  ? 09/18/21 1810  ?TempSrc:   ?PainSc: 3   ? ? ?  ?  ?  ?  ?  ?  ? ?Audry Pili ? ? ? ? ?

## 2021-09-20 LAB — SURGICAL PATHOLOGY

## 2021-10-08 ENCOUNTER — Ambulatory Visit
Admission: EM | Admit: 2021-10-08 | Discharge: 2021-10-08 | Disposition: A | Discharge status: Home / Self Care | Payer: BC Managed Care – PPO | Attending: Student | Admitting: Student

## 2021-10-08 DIAGNOSIS — Z112 Encounter for screening for other bacterial diseases: Secondary | ICD-10-CM | POA: Insufficient documentation

## 2021-10-08 DIAGNOSIS — J029 Acute pharyngitis, unspecified: Secondary | ICD-10-CM | POA: Diagnosis present

## 2021-10-08 LAB — GROUP A STREP BY PCR: Group A Strep by PCR: NOT DETECTED

## 2021-10-08 MED ORDER — LIDOCAINE VISCOUS HCL 2 % MT SOLN: 15.0000 mL | OROMUCOSAL | 0 refills | Status: AC | PRN

## 2021-10-08 NOTE — ED Provider Notes (Signed)
Sent [facility]    CSN: [id number] Arrival date & time: 10/08/21  1507      History   Chief Complaint Chief Complaint  Patient presents with   Sore Throat    Think I have strep throat - Entered by patient    HPI Michelle Peck is a 46 y.o. female presenting with sore throat for 2 days.  History noncontributory.  Describes sore throat, pain with swallowing.  Pain in the glands in her throat.  Denies any other symptoms, including known fevers, cough, congestion, nausea, vomiting, diarrhea, shortness of breath, sensation of throat closing, chest pain. No known sick exposure.  HPI  History reviewed. No pertinent past medical history.  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      Home Medications    Prior to Admission medications   Medication Sig Start Date End Date Taking? Authorizing Provider  citalopram (CELEXA) 20 MG tablet Take 20 mg by mouth daily. 11/17/17  Yes [provider]  JUNEL 1/20 1-20 MG-MCG tablet Take 1 tablet by mouth daily. 08/22/21  Yes [provider]  lidocaine (XYLOCAINE) 2 % solution Use as directed 15 mLs in the mouth or throat every 4 (four) hours as needed for mouth pain. Swish/gargle and spit 10/08/21  Yes Hazel Sams, PA-C  Multiple Vitamin (MULTIVITAMINS PO) Take by mouth. Chew 2 gummies daily   Yes [provider]    Family History Family History  Problem Relation Age of Onset   Healthy Mother    Healthy Father     Social History Social History   Tobacco Use   Smoking status: Never   Smokeless tobacco: Never  Vaping Use    Vaping Use: Never used  Substance Use Topics   Alcohol use: Yes    Comment: socially   Drug use: No     Allergies   Iohexol and Red dye   Review of Systems Review of Systems  HENT:  Positive for sore throat.   All other systems reviewed and are negative.   Physical Exam Triage Vital Signs ED Triage Vitals  Enc Vitals Group     BP 10/08/21 1517 121/90     Pulse Rate 10/08/21 1517 83     Resp 10/08/21 1517 18     Temp 10/08/21 1517 98.2 F (36.8 C)     Temp Source 10/08/21 1517 Oral     SpO2 10/08/21 1517 100 %     Weight 10/08/21 1515 165 lb (74.8 kg)     Height 10/08/21 1515 5' 8.5" (1.74 m)     Head Circumference --      Peak Flow --      Pain Score 10/08/21 1515 5     Pain Loc --      Pain Edu? --      Excl. in [place]? --    No data found.  Updated Vital Signs BP 121/90 (BP Location: Left Arm)   Pulse 83   Temp 98.2 F (36.8 C) (Oral)   Resp 18   Ht  5' 8.5" (1.74 m)   Wt 165 lb (74.8 kg)   SpO2 100%   BMI 24.72 kg/m   Visual Acuity Right Eye Distance:   Left Eye Distance:   Bilateral Distance:    Right Eye Near:   Left Eye Near:    Bilateral Near:     Physical Exam Vitals reviewed.  Constitutional:      General: She is not in acute distress.    Appearance: Normal appearance. She is not ill-appearing or diaphoretic.  HENT:     Head: Normocephalic and atraumatic.     Mouth/Throat:     Pharynx: Uvula midline. Posterior oropharyngeal erythema present.     Tonsils: No tonsillar exudate. 1+ on the right. 1+ on the left.     Comments: Minimal erythema posterior pharynx. Tonsils are small and without exudate. On exam, uvula is midline, she is tolerating her secretions without difficulty, there is no trismus, no drooling, she has normal phonation  Cardiovascular:     Rate and Rhythm: Normal rate and regular rhythm.     Heart sounds: Normal heart sounds.  Pulmonary:     Effort: Pulmonary effort is normal.     Breath sounds: Normal breath sounds.   Lymphadenopathy:     Cervical: Cervical adenopathy present.     Right cervical: Superficial cervical adenopathy present.     Left cervical: Superficial cervical adenopathy present.  Skin:    General: Skin is warm.  Neurological:     General: No focal deficit present.     Mental Status: She is alert and oriented to person, place, and time.  Psychiatric:        Mood and Affect: Mood normal.        Behavior: Behavior normal.        Thought Content: Thought content normal.        Judgment: Judgment normal.     UC Treatments / Results  Labs (all labs ordered are listed, but only abnormal results are displayed) Labs Reviewed  GROUP A STREP BY PCR    EKG   Radiology No results found.  Procedures Procedures (including critical care time)  Medications Ordered in UC Medications - No data to display  Initial Impression / Assessment and Plan / UC Course  I have reviewed the triage vital signs and the nursing notes.  Pertinent labs & imaging results that were available during my care of the patient were reviewed by me and considered in my medical decision making (see chart for details).     This patient is a very pleasant 46 y.o. year old female presenting with pharyngitis. Afebrile, nontachy.  Today on exam tonsils are small; there is no asymmetry, low suspicion for deep space infection.  No evidence of bacteremia, sepsis. Strep PCR negative. Will manage symptomatically with viscous lidocaine. Return if any symptoms change - swelling, pain, sensation of throat closing, high fevers, etc.    Final Clinical Impressions(s) / UC Diagnoses   Final diagnoses:  Viral pharyngitis  Screening for streptococcal infection     Discharge Instructions      Declines AVS   ED Prescriptions     Medication Sig Dispense Auth. Provider   lidocaine (XYLOCAINE) 2 % solution Use as directed 15 mLs in the mouth or throat every 4 (four) hours as needed for mouth pain. Swish/gargle and spit  100 mL Hazel Sams, PA-C      PDMP not reviewed this encounter.   Hazel Sams, PA-C 10/08/21 1620

## 2021-10-08 NOTE — Discharge Instructions (Addendum)
Declines AVS

## 2021-10-08 NOTE — ED Triage Notes (Signed)
Patient c.o thinking she has strep.  Sore throat -- Started yesterday.

## 2021-10-10 ENCOUNTER — Ambulatory Visit
Admission: EM | Admit: 2021-10-10 | Discharge: 2021-10-10 | Disposition: A | Discharge status: Home / Self Care | Payer: BC Managed Care – PPO | Attending: Physician Assistant | Admitting: Physician Assistant

## 2021-10-10 DIAGNOSIS — J029 Acute pharyngitis, unspecified: Secondary | ICD-10-CM | POA: Insufficient documentation

## 2021-10-10 DIAGNOSIS — R1310 Dysphagia, unspecified: Secondary | ICD-10-CM | POA: Insufficient documentation

## 2021-10-10 DIAGNOSIS — Z20822 Contact with and (suspected) exposure to covid-19: Secondary | ICD-10-CM | POA: Insufficient documentation

## 2021-10-10 DIAGNOSIS — R220 Localized swelling, mass and lump, head: Secondary | ICD-10-CM | POA: Insufficient documentation

## 2021-10-10 LAB — GROUP A STREP BY PCR: Group A Strep by PCR: NOT DETECTED

## 2021-10-10 LAB — SARS CORONAVIRUS 2 BY RT PCR: SARS Coronavirus 2 by RT PCR: NEGATIVE

## 2021-10-10 NOTE — Discharge Instructions (Signed)
I have concern for possible abscess so you need to go to the ER for imaging and treatment.  You have been advised to follow up immediately in the emergency department for concerning signs.symptoms. If you declined EMS transport, please have a family member take you directly to the ED at this time. Do not delay. Based on concerns about condition, if you do not follow up in th e ED, you may risk poor outcomes including worsening of condition, delayed treatment and potentially life threatening issues. If you have declined to go to the ED at this time, you should call your PCP immediately to set up a follow up appointment.  Go to ED for red flag symptoms, including; fevers you cannot reduce with Tylenol/Motrin, severe headaches, vision changes, numbness/weakness in part of the body, lethargy, confusion, intractable vomiting, severe dehydration, chest pain, breathing difficulty, severe persistent abdominal or pelvic pain, signs of severe infection (increased redness, swelling of an area), feeling faint or passing out, dizziness, etc. You should especially go to the ED for sudden acute worsening of condition if you do not elect to go at this time.

## 2021-10-10 NOTE — ED Triage Notes (Signed)
Patient presents to [facility] for follow-up from 05/29 visit. She continues to have sore throat. She states pain in jaw and swollen glands.   Treating symptoms with ibuprofen.  Denies fever.

## 2021-10-10 NOTE — ED Provider Notes (Signed)
[facility]    CSN: [id number] Arrival date & time: 10/10/21  5027      History   Chief Complaint Chief Complaint  Patient presents with   Sore Throat    Entered by patient   Follow-up    HPI Michelle Peck is a 46 y.o. female returning to urgent care 2 days after previous visit.  Patient was initially seen for sore throat that she continues to have a sore throat but over the last 24 hours she has developed swelling of her neck on the right side and right side of her face and jaw.  Reports difficulty swallowing but is able to swallow food and fluids.  Unsure if any fever as she has regularly been taking ibuprofen but she has had chills and fatigue.  Patient says her throat is very severe.  Reports that she had acute appendicitis a few weeks ago and her pain is twice as bad as when she had appendicitis.  No associated cough, congestion, breathing difficulty, vomiting or diarrhea.  No sick contacts.  Patient had a negative strep test 2 days ago.  No other complaints.  HPI  History reviewed. No pertinent past medical history.  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      Home Medications    Prior to Admission medications   Medication Sig Start Date End Date Taking? Authorizing Provider  citalopram (CELEXA) 20 MG tablet Take 20 mg by mouth daily. 11/17/17   [provider]  JUNEL 1/20 1-20 MG-MCG tablet Take 1 tablet by mouth daily. 08/22/21   [provider]  lidocaine (XYLOCAINE) 2 % solution Use as directed 15 mLs in the mouth or throat every 4 (four) hours as needed for mouth  pain. Swish/gargle and spit 10/08/21   Hazel Sams, PA-C  Multiple Vitamin (MULTIVITAMINS PO) Take by mouth. Chew 2 gummies daily    [provider]    Family History Family History  Problem Relation Age of Onset   Healthy Mother    Healthy Father     Social History Social History   Tobacco Use   Smoking status: Never   Smokeless tobacco: Never  Vaping Use   Vaping Use: Never used  Substance Use Topics   Alcohol use: Yes    Comment: socially   Drug use: No     Allergies   Iohexol and Red dye   Review of Systems Review of Systems  Constitutional:  Positive for chills and fatigue. Negative for diaphoresis and fever.  HENT:  Positive for facial swelling, sore throat and trouble swallowing. Negative for congestion, ear pain, rhinorrhea, sinus pressure and sinus pain.   Respiratory:  Negative for cough and shortness of breath.   Gastrointestinal:  Negative for abdominal pain, nausea and vomiting.  Musculoskeletal:  Negative for arthralgias and myalgias.  Skin:  Negative for rash.  Neurological:  Negative for weakness and headaches.  Hematological:  Positive for adenopathy.    Physical Exam Triage Vital Signs ED Triage Vitals  Enc Vitals Group  BP      Pulse      Resp      Temp      Temp src      SpO2      Weight      Height      Head Circumference      Peak Flow      Pain Score      Pain Loc      Pain Edu?      Excl. in [place]?    No data found.  Updated Vital Signs BP 115/84 (BP Location: Left Arm)   Pulse 97   Temp 98.4 F (36.9 C) (Oral)   Resp 18   SpO2 99%      Physical Exam Vitals and nursing note reviewed.  Constitutional:      General: She is not in acute distress.    Appearance: Normal appearance. She is ill-appearing. She is not toxic-appearing.  HENT:     Head: Normocephalic and atraumatic.     Comments: Significant swelling of right cheek, jaw and right neck as well as submandibular region.  Tenderness to palpation of  submandibular area and right anterior neck.    Nose: Nose normal.     Mouth/Throat:     Mouth: Mucous membranes are moist.     Pharynx: Oropharynx is clear. Uvula midline. Posterior oropharyngeal erythema present.     Comments: Unable to fully visualize tonsils due to patient's anatomy Eyes:     General: No scleral icterus.       Right eye: No discharge.        Left eye: No discharge.     Conjunctiva/sclera: Conjunctivae normal.  Cardiovascular:     Rate and Rhythm: Normal rate and regular rhythm.     Heart sounds: Normal heart sounds.  Pulmonary:     Effort: Pulmonary effort is normal. No respiratory distress.     Breath sounds: Normal breath sounds.  Musculoskeletal:     Cervical back: Normal range of motion and neck supple.  Skin:    General: Skin is dry.  Neurological:     General: No focal deficit present.     Mental Status: She is alert. Mental status is at baseline.     Motor: No weakness.     Gait: Gait normal.  Psychiatric:        Mood and Affect: Mood normal.        Behavior: Behavior normal.        Thought Content: Thought content normal.     UC Treatments / Results  Labs (all labs ordered are listed, but only abnormal results are displayed) Labs Reviewed  GROUP A STREP BY PCR  SARS CORONAVIRUS 2 BY RT PCR    EKG   Radiology No results found.  Procedures Procedures (including critical care time)  Medications Ordered in UC Medications - No data to display  Initial Impression / Assessment and Plan / UC Course  I have reviewed the triage vital signs and the nursing notes.  Pertinent labs & imaging results that were available during my care of the patient were reviewed by me and considered in my medical decision making (see chart for details).  46 year old female returning to urgent care 2 days after previous visit for continued sore throat with significant facial swelling over the past 24 hours.  Unsure of any fevers that she has been taking  antipyretics around-the-clock for pain relief.  She is reporting difficulty swallowing, chills.  Vitals are  stable today.  She is ill-appearing but nontoxic.  Exam significant for erythema posterior pharynx.  Unable to fully visualize entire oropharynx due to her anatomy and facial/neck swelling. She has significant swelling of the right side of her face, jaw, submandibular region, right neck.  Tenderness of submandibular area and right anterior neck.  Chest clear to auscultation and heart regular rate rhythm.  PCR strep test and PCR COVID test obtained.  Awaiting results. Both tests negative.  Given the amount of swelling I have concerns for retropharyngeal abscess or peritonsillar abscess.  Advised patient she should have imaging performed and unfortunately were unable to do that in urgent care setting.  Advised to emergency department at this time.  Her husband will take her to [place] at this time.  She is leaving in stable condition.  Advised we will call with the test results but it does not change the fact that she needs imaging and to be evaluated in ED setting at this time.   Final Clinical Impressions(s) / UC Diagnoses   Final diagnoses:  Facial swelling  Sore throat  Dysphagia, unspecified type     Discharge Instructions      I have concern for possible abscess so you need to go to the ER for imaging and treatment.  You have been advised to follow up immediately in the emergency department for concerning signs.symptoms. If you declined EMS transport, please have a family member take you directly to the ED at this time. Do not delay. Based on concerns about condition, if you do not follow up in th e ED, you may risk poor outcomes including worsening of condition, delayed treatment and potentially life threatening issues. If you have declined to go to the ED at this time, you should call your PCP immediately to set up a follow up appointment.  Go to ED for red flag  symptoms, including; fevers you cannot reduce with Tylenol/Motrin, severe headaches, vision changes, numbness/weakness in part of the body, lethargy, confusion, intractable vomiting, severe dehydration, chest pain, breathing difficulty, severe persistent abdominal or pelvic pain, signs of severe infection (increased redness, swelling of an area), feeling faint or passing out, dizziness, etc. You should especially go to the ED for sudden acute worsening of condition if you do not elect to go at this time.      ED Prescriptions   None    PDMP not reviewed this encounter.   Danton Clap, PA-C 10/10/21 1742

## 2021-10-10 NOTE — ED Notes (Signed)
Patient is being discharged from the Urgent Care and sent to the Emergency Department via personal vehicle . Per Carlyon Prows, patient is in need of higher level of care due to needing imaging. Patient is aware and verbalizes understanding of plan of care.  Vitals:   10/10/21 0855  BP: 115/84  Pulse: 97  Resp: 18  Temp: 98.4 F (36.9 C)  SpO2: 99%

## 2023-05-12 IMAGING — CT CT ABD-PELV W/O CM
2 of 4 series · 16 of 46 positions shown, 18 images · non-contrast
Comparison: 02/13/2004

CLINICAL DATA: Right lower quadrant pain.



[Series 2: abd pel wo · axial · 0.80mm/px · z∈[-1173,-738]mm · 13 of 95 slices shown, 15 images]
[im 4/95  soft-tissue]
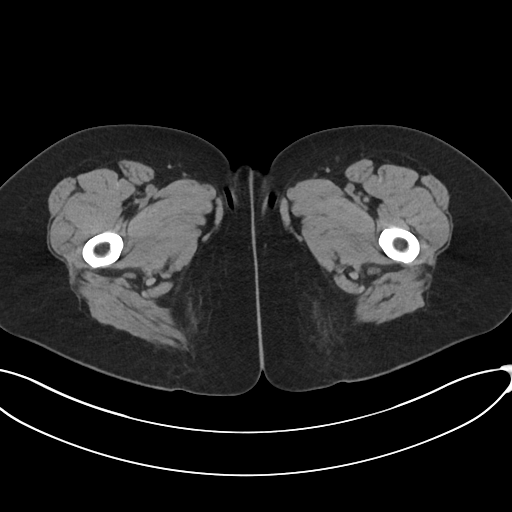
[im 4/95  bone]
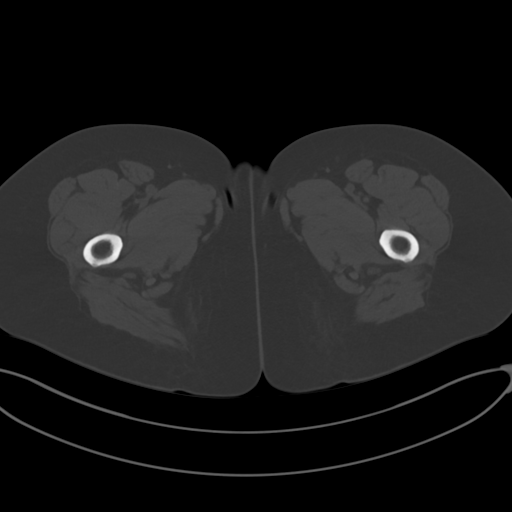
[im 12/95  soft-tissue]
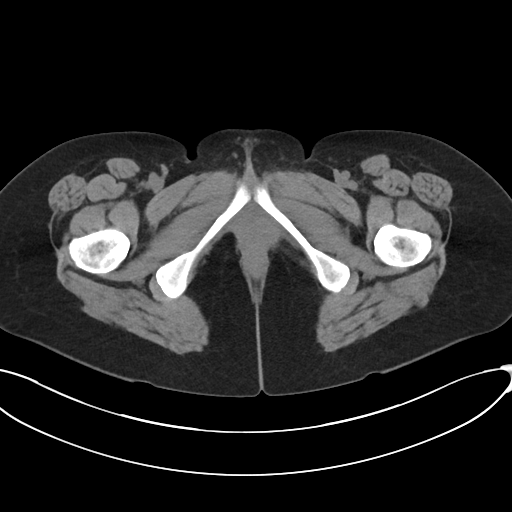
[im 19/95  soft-tissue]
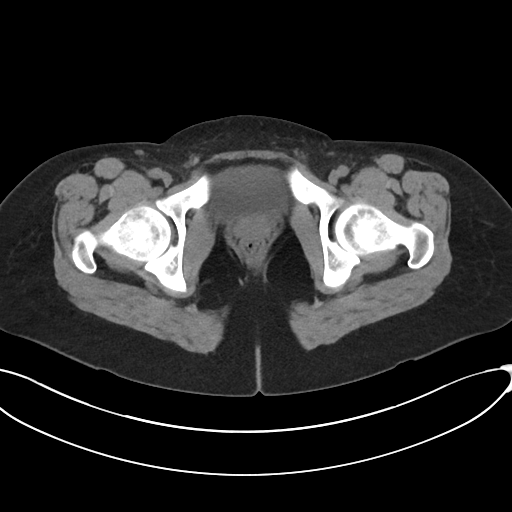
[im 27/95  soft-tissue]
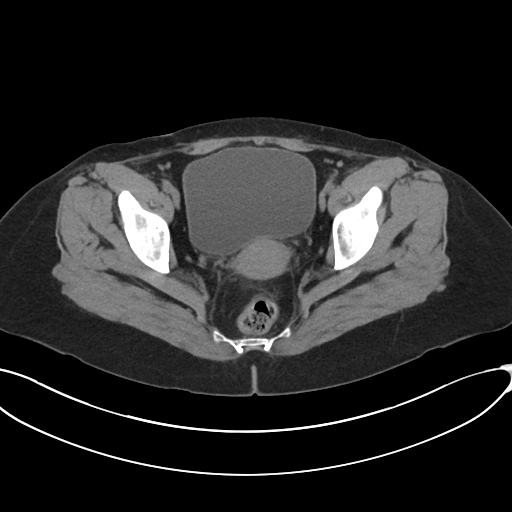
[im 34/95  soft-tissue]
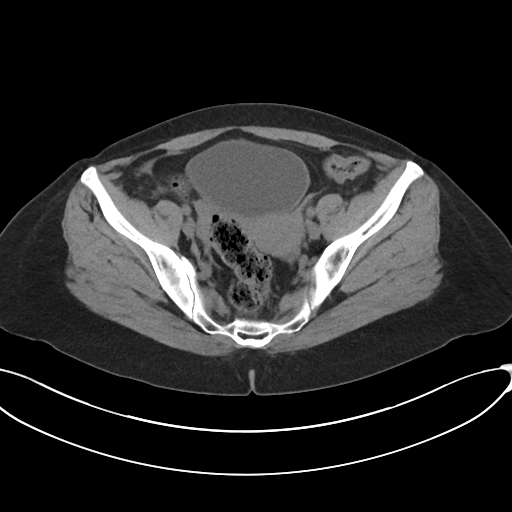
[im 42/95  soft-tissue]
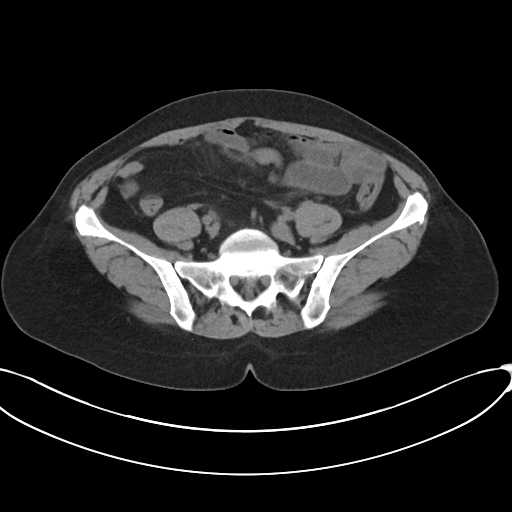
[im 49/95  soft-tissue]
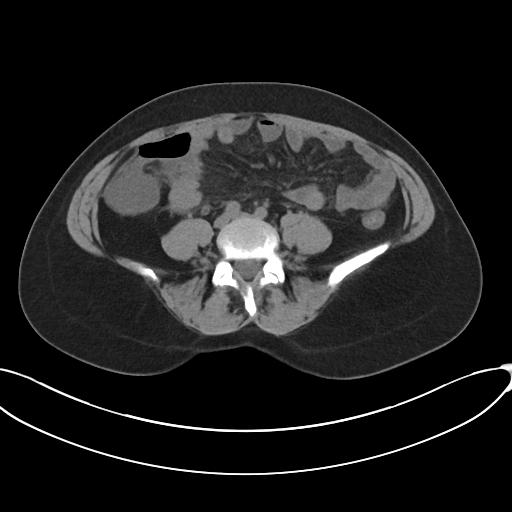
[im 53/95  soft-tissue]
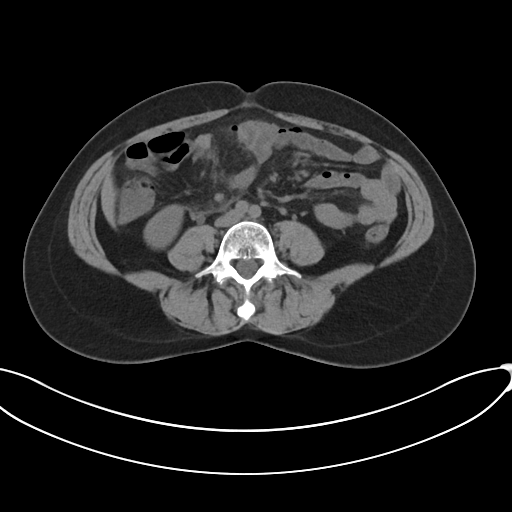
[im 61/95  soft-tissue]
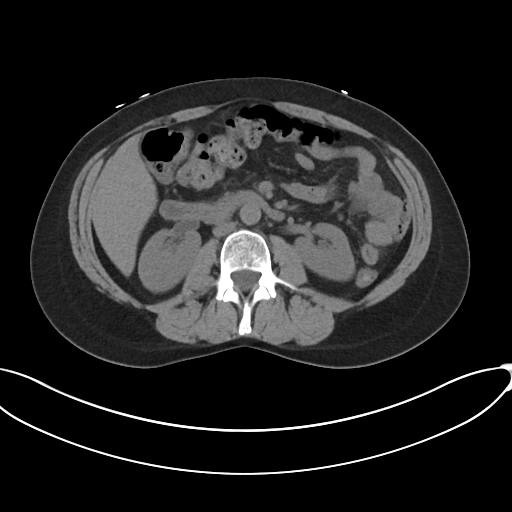
[im 61/95  bone]
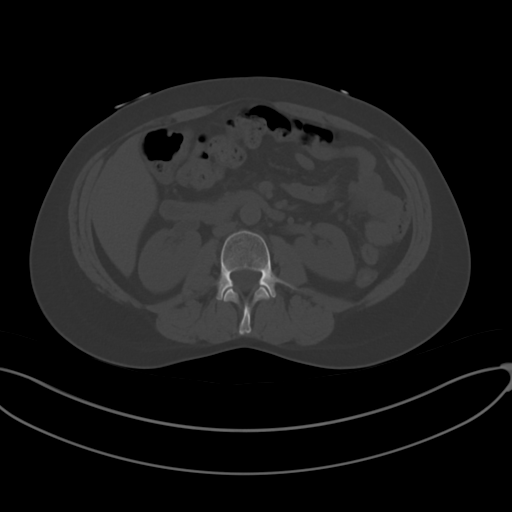
[im 68/95  soft-tissue]
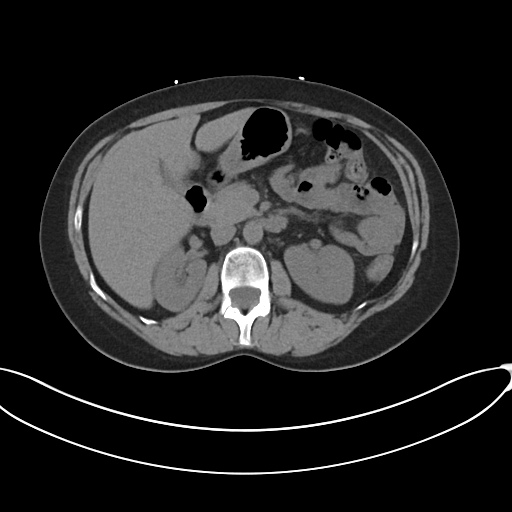
[im 76/95  soft-tissue]
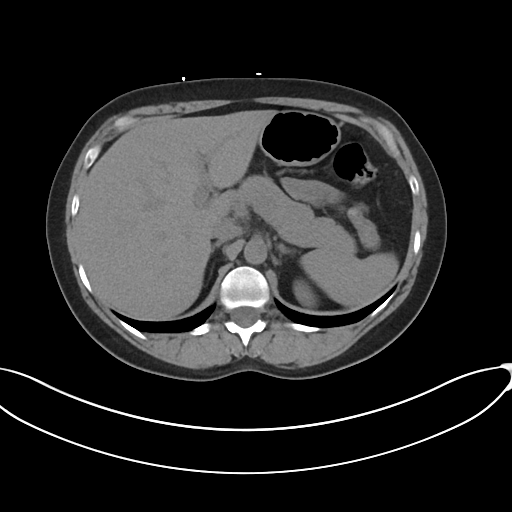
[im 83/95  soft-tissue]
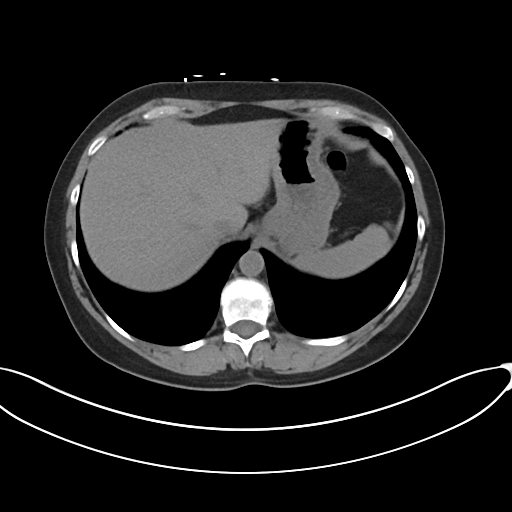
[im 91/95  soft-tissue]
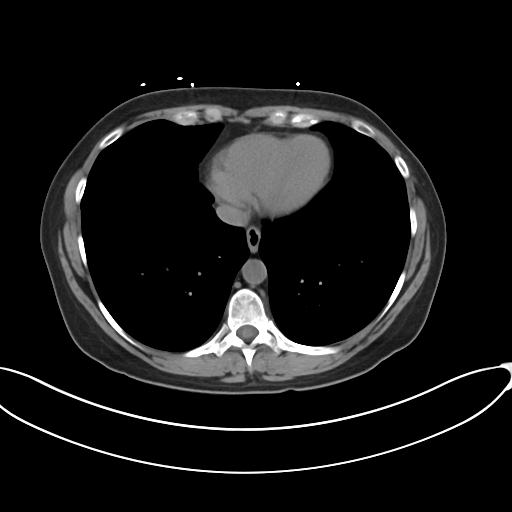

[Series 5: coronal · coronal · 0.74mm/px · 3 of 88 slices shown]
[im 30/88  soft-tissue]
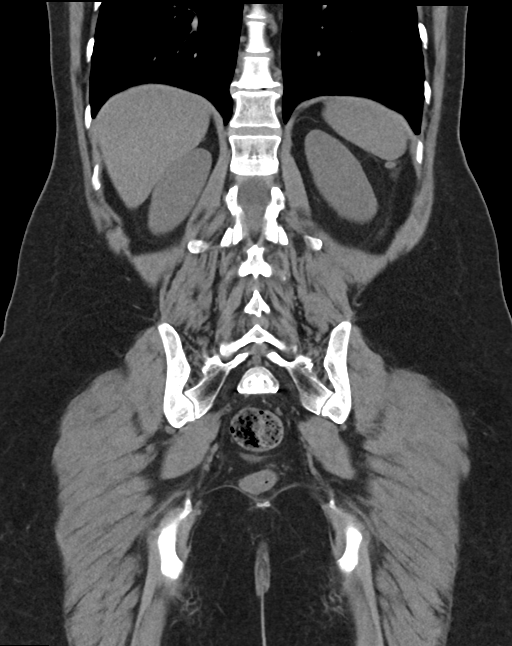
[im 39/88  soft-tissue]
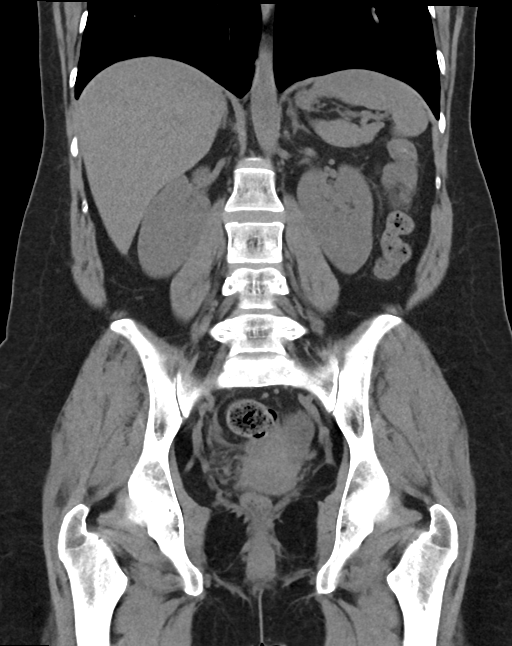
[im 49/88  soft-tissue]
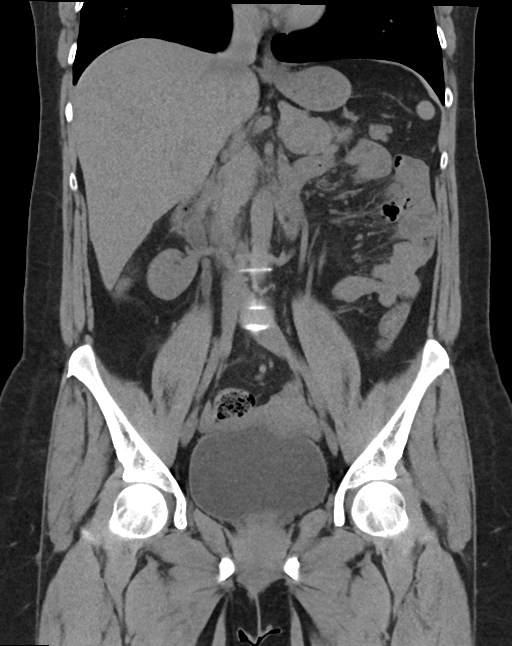

[16 of 46 positions shown; findings below may reference images not displayed]

FINDINGS: Lower chest: No acute abnormality.

Hepatobiliary: No focal liver abnormality is seen. No gallstones,
gallbladder wall thickening, or biliary dilatation.

Pancreas: Unremarkable. No pancreatic ductal dilatation or
surrounding inflammatory changes.

Spleen: Normal in size without focal abnormality.

Adrenals/Urinary Tract: Adrenal glands are unremarkable. Kidneys are
normal, without renal calculi, focal lesion, or hydronephrosis.
Bladder is unremarkable.

Stomach/Bowel: Stomach is within normal limits. No evidence of bowel
wall thickening, distention, or inflammatory changes. Dilated
appendix measuring 11 mm in diameter with periappendiceal
inflammatory changes. No focal fluid collection to suggest an
abscess.

Vascular/Lymphatic: No significant vascular findings are present. No
enlarged abdominal or pelvic lymph nodes.

Reproductive: Uterus and bilateral adnexa are unremarkable.

Other: No abdominal wall hernia or abnormality. No abdominopelvic
ascites.

Musculoskeletal: No acute osseous abnormality. No aggressive osseous
lesion.
IMPRESSION: 1. Findings consistent with acute appendicitis. No focal fluid
collection to suggest an abscess.
